# Patient Record
Sex: Male | Born: 1960
Health system: Southern US, Community
[De-identification: ages and names within clinical notes are randomized; demographics above are authoritative.]

## PROBLEM LIST (undated history)

## (undated) DIAGNOSIS — R42 Dizziness and giddiness: Secondary | ICD-10-CM

## (undated) DIAGNOSIS — K219 Gastro-esophageal reflux disease without esophagitis: Secondary | ICD-10-CM

## (undated) DIAGNOSIS — F1011 Alcohol abuse, in remission: Secondary | ICD-10-CM

## (undated) DIAGNOSIS — I10 Essential (primary) hypertension: Secondary | ICD-10-CM

## (undated) DIAGNOSIS — Z789 Other specified health status: Secondary | ICD-10-CM

## (undated) DIAGNOSIS — E785 Hyperlipidemia, unspecified: Secondary | ICD-10-CM

## (undated) DIAGNOSIS — IMO0001 Reserved for inherently not codable concepts without codable children: Secondary | ICD-10-CM

## (undated) DIAGNOSIS — F419 Anxiety disorder, unspecified: Secondary | ICD-10-CM

## (undated) HISTORY — DX: Essential (primary) hypertension: I10

## (undated) HISTORY — DX: Hyperlipidemia, unspecified: E78.5

## (undated) HISTORY — PX: VASECTOMY: SHX75

## (undated) HISTORY — DX: Gastro-esophageal reflux disease without esophagitis: K21.9

---

## 2007-08-10 ENCOUNTER — Ambulatory Visit (HOSPITAL_COMMUNITY): Admission: RE | Admit: 2007-08-10 | Discharge: 2007-08-10 | Payer: Self-pay | Admitting: Orthopedic Surgery

## 2008-09-11 ENCOUNTER — Encounter: Admission: RE | Admit: 2008-09-11 | Discharge: 2008-09-11 | Payer: Self-pay | Admitting: Surgery

## 2008-09-12 ENCOUNTER — Ambulatory Visit (HOSPITAL_BASED_OUTPATIENT_CLINIC_OR_DEPARTMENT_OTHER): Admission: RE | Admit: 2008-09-12 | Discharge: 2008-09-12 | Payer: Self-pay | Admitting: Surgery

## 2010-10-08 LAB — CBC
Hemoglobin: 17.2 g/dL — ABNORMAL HIGH (ref 13.0–17.0)
RBC: 4.69 MIL/uL (ref 4.22–5.81)
WBC: 3.8 10*3/uL — ABNORMAL LOW (ref 4.0–10.5)

## 2010-10-08 LAB — COMPREHENSIVE METABOLIC PANEL
ALT: 127 U/L — ABNORMAL HIGH (ref 0–53)
AST: 80 U/L — ABNORMAL HIGH (ref 0–37)
Alkaline Phosphatase: 46 U/L (ref 39–117)
CO2: 28 mEq/L (ref 19–32)
Calcium: 10.1 mg/dL (ref 8.4–10.5)
Chloride: 102 mEq/L (ref 96–112)
GFR calc Af Amer: >60 mL/min (ref >60)
GFR calc non Af Amer: >60 mL/min (ref >60)
Glucose, Bld: 86 mg/dL (ref 70–99)
Potassium: 4.1 mEq/L (ref 3.5–5.1)
Sodium: 139 mEq/L (ref 135–145)

## 2010-10-08 LAB — DIFFERENTIAL
Basophils Relative: 1 % (ref 0–1)
Eosinophils Absolute: 0.1 10*3/uL (ref 0.0–0.7)
Eosinophils Relative: 2 % (ref 0–5)
Lymphs Abs: 1.7 10*3/uL (ref 0.7–4.0)
Neutrophils Relative %: 41 % — ABNORMAL LOW (ref 43–77)
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
## 2010-11-10 NOTE — Op Note (Signed)
NAME:  Christopher Jones, Christopher Jones NO.:  [id number]   MEDICAL RECORD NO.:  [id number]          PATIENT TYPE:  AMB   LOCATION:  DSC                          FACILITY:  [facility]   PHYSICIAN:  Thomas A. Cornett, M.D.DATE OF BIRTH:  1961/02/19   DATE OF PROCEDURE:  DATE OF DISCHARGE:                               OPERATIVE REPORT   PREOPERATIVE DIAGNOSIS:  Right inguinal hernia.   POSTOPERATIVE DIAGNOSIS:  Right inguinal hernia.   PROCEDURE:  Repair of right inguinal hernia with mesh.   SURGEON:  Maisie Fus A. Cornett, MD   ANESTHESIA:  LMA with 0.25% Sensorcaine with epinephrine local.   ESTIMATED BLOOD LOSS:  20 mL.   SPECIMENS:  None.   INDICATIONS FOR PROCEDURE:  The patient is a 50 year old male with a  symptomatic right inguinal hernia that he wished to have repaired due to  pain.  He presents today for that after discussion in the office about  the procedure, potential complications, and long-term expectations and  potential outcomes.   DESCRIPTION OF PROCEDURE:  The patient was brought to the operating room  and placed supine.  After induction of general anesthesia, the right  inguinal region was marked preoperatively with the patient.  It was then  prepped and draped in a sterile fashion.  An oblique incision was made  in the right inguinal crease after infiltration with 0.25% Sensorcaine.  Dissection was carried down through Scarpa fascia.  The superficial  epigastric vessels were ligated with 3-0 Vicryl.  The Scarpa fascia was  then opened further.  We then visualized the aponeurosis of the external  oblique, infiltrated with 0.25% Sensorcaine under it and opened it in  the direction of its fibers.  The cord structures were identified and  encircled with a 1/4-inch Penrose drain.  There was a large indirect  hernia and I was able to dissect this off the cord and reduce it back  into the preperitoneal space without difficulty.  I then used my fingers  to bluntly  dissect in the preperitoneal space.  A large Prolene hernia  system mesh was then placed with the inner leaflet in the preperitoneal  space and I used my finger to help spread this out.  The onlay piece  laid nicely on the floor of the inguinal canal.  I used a single stitch  of 0-Vicryl to secure the connector piece to the internal oblique  medially and a second 0-Vicryl stitch to secure the onlay piece down to  the pubic tubercle, Cooper ligament region.  We then secured the  remainder of the mesh to the shelving edge of the inguinal ligament with  0-Novofil as well as the internal oblique.  A branch of the ilioinguinal  nerve was divided since it was caught up under the mesh and was  concerned for postoperative pain from that.  The main trunk of the ileal  inguinal nerve coursed along with cord structures and I was able to  preserve it.  There was also a branch of the iliohypogastric nerve as  well that I had to ligate since it was caught up under the  mesh and  there was no way I could bring this out without fear of the mesh  scarring down to it.  This was done with cautery so that the other end  would retract back up into the muscle which it did.  At this point in  time, a slit was cut of cord structures and Novofil was used to close  the mesh around the cord without difficulty.  I could insert the tip of  my fifth digit around new internal ring and this was comfortable and not  tight.  Irrigation was used and suctioned out.  We closed the  aponeurosis and the external oblique with 2-0 Vicryl  and 2-0 Vicryl was used to close the subcu tissues and 4-0 Monocryl was  used to close the skin in a subcuticular fashion.  Dermabond was  applied.  All final counts of sponge, needle, and instrument were found  to be correct at this portion of the case.  The patient was then awoke  and taken to recovery in satisfactory condition.      Thomas A. Cornett, M.D.  Electronically Signed      TAC/MEDQ  D:  09/12/2008  T:  09/12/2008  Job:  [id number]

## 2011-03-19 LAB — COMPREHENSIVE METABOLIC PANEL
ALT: 55 — ABNORMAL HIGH
AST: 42 — ABNORMAL HIGH
Albumin: 4.6
Calcium: 9.6
Chloride: 104
Creatinine, Ser: 0.91
GFR calc Af Amer: >60
Sodium: 141

## 2011-03-19 LAB — CBC
MCHC: 34.7
MCV: 101.3 — ABNORMAL HIGH
Platelets: 209
RBC: 4.61
WBC: 5.1

## 2011-03-19 LAB — DIFFERENTIAL
Eosinophils Absolute: 0.2
Eosinophils Relative: 4
Lymphocytes Relative: 34
Lymphs Abs: 1.7
Monocytes Absolute: 0.4

## 2011-06-29 HISTORY — PX: SHOULDER ARTHROSCOPY: SHX128

## 2011-10-15 ENCOUNTER — Other Ambulatory Visit: Payer: Self-pay | Admitting: Family Medicine

## 2011-10-15 DIAGNOSIS — R7989 Other specified abnormal findings of blood chemistry: Secondary | ICD-10-CM

## 2011-10-25 ENCOUNTER — Inpatient Hospital Stay
Admission: RE | Admit: 2011-10-25 | Discharge: 2011-10-25 | Payer: Self-pay | Source: Ambulatory Visit | Attending: Family Medicine | Admitting: Family Medicine

## 2011-11-01 ENCOUNTER — Ambulatory Visit
Admission: RE | Admit: 2011-11-01 | Discharge: 2011-11-01 | Disposition: A | Discharge status: Home / Self Care | Payer: Managed Care, Other (non HMO) | Source: Ambulatory Visit | Attending: Family Medicine | Admitting: Family Medicine

## 2011-11-01 DIAGNOSIS — R7989 Other specified abnormal findings of blood chemistry: Secondary | ICD-10-CM

## 2012-04-18 ENCOUNTER — Other Ambulatory Visit: Payer: Self-pay | Admitting: Gastroenterology

## 2012-04-18 DIAGNOSIS — K625 Hemorrhage of anus and rectum: Secondary | ICD-10-CM

## 2012-04-27 ENCOUNTER — Encounter (INDEPENDENT_AMBULATORY_CARE_PROVIDER_SITE_OTHER): Payer: Managed Care, Other (non HMO) | Admitting: General Surgery

## 2012-05-16 ENCOUNTER — Ambulatory Visit (INDEPENDENT_AMBULATORY_CARE_PROVIDER_SITE_OTHER): Payer: Managed Care, Other (non HMO) | Admitting: General Surgery

## 2012-05-16 ENCOUNTER — Encounter (INDEPENDENT_AMBULATORY_CARE_PROVIDER_SITE_OTHER): Payer: Self-pay | Admitting: General Surgery

## 2012-05-16 VITALS — BP 144/92 | HR 80 | Temp 97.2°F | Resp 18 | Ht 72.0 in | Wt 260.0 lb

## 2012-05-16 DIAGNOSIS — L290 Pruritus ani: Secondary | ICD-10-CM | POA: Insufficient documentation

## 2012-05-16 NOTE — Patient Instructions (Addendum)
Pruritus Ani  What is Pruritus Ani (proo-r-tus a-n)? Itching around the anal area is called pruritus ani. This condition results in a compelling urge to scratch. What causes this to happen? Several factors may be at fault. A common cause is excessive moisture in the anal area. Moisture may be due to perspiration or a small amount of residual stool around the anal area. Pruritis ani may be a symptom of other common anal conditions such as hemorrhoids and anal fissures. The initial condition can be made worse by scratching, vigorous cleansing of the area or overuse of topical treatments. In some individuals pruritus ani may be caused by eating certain foods, smoking and drinking alcoholic beverages, especially beer and wine. Food items that have been associated with pruritus ani include: . Coffee, Tea . Carbonated beverages . Milk products . Tomatoes and tomato products such as Ketchup . Cheese . Chocolate . Nuts Does Pruritus Ani result from lack of cleanliness? Cleanliness is almost never a factor. However, the natural tendency once a person develops this itching is to wash the area vigorously and frequently with soap and a washcloth. This almost always makes the problem worse by damaging the skin and washing away protective natural oils. What can be done to make this itching go away? A careful examination by a colon and rectal surgeon or other physician may identify a definite cause for the itching. Your physician can recommend treatment to eliminate the specific problem. Treatment of pruritus ani may include these three points. 1. AVOID MOISTURE in the anal area: . Apply either a few wisps of cotton, a 4 x 4 gauze or some cornstarch powder to keep the area dry. . Avoid all medicated, perfumed and deodorant powders. 2. AVOID FURTHER TRAUMA to the affected area: . Do not use soap of any kind on the anal area. . Do not scrub the anal area with anything - even toilet paper. . For hygiene, it  is best to rinse with warm water and pat the area dry. Use wet toilet paper, baby wipes or a wet washcloth to blot the area clean. Never rub. . Try not to scratch the itchy area. Scratching produces more damage, which in turn makes the itching worse. For individuals that experience irresistible itching at night, wearing socks on the hands may be helpful. 3. USE ONLY MEDICATIONS AS DIRECTED BY YOUR PHYSICIAN. Apply prescription medications sparingly to the skin around the anal area and avoid rubbing. Prolonged use of prescribed or over the counter topical medications may result in irritation or skin dryness that can make the condition worse. How long does this treatment usually take? Most people experience some relief from itching within a week. If symptoms do not resolve after 6 weeks, a follow-up appointment with your colon and rectal surgeon may be needed.               2012 American Society of Colon [employer] Rectal [employer]  

## 2012-05-16 NOTE — Progress Notes (Signed)
Chief Complaint  Patient presents with  . Rectal Problems    anal tag    HISTORY: Christopher Jones is a 51 y.o. male who presents to the office with anal itching.  Other symptoms include inability to get clean.  He has tried medicated wipes in the past no success.  Wiping the area makes the symptoms worse.  This had been occurring for several years.  His bowel habits are regular and his bowel movements are soft.  His fiber intake is moderate.  His last colonoscopy was in Oct where 2 tubular adenomas were found.    Past Medical History  Diagnosis Date  . Hypertension   . Hyperlipidemia   . GERD (gastroesophageal reflux disease)       Past Surgical History  Procedure Date  . Shoulder arthroscopy   . Vasectomy         Current Outpatient Prescriptions  Medication Sig Dispense Refill  . ampicillin (PRINCIPEN) 500 MG capsule Take 500 mg by mouth 2 (two) times daily.      Marland Kitchen aspirin 81 MG tablet Take 81 mg by mouth daily.      . B Complex-C (B-COMPLEX WITH VITAMIN C) tablet Take 1 tablet by mouth daily.      Marland Kitchen esomeprazole (NEXIUM) 40 MG capsule Take 40 mg by mouth daily before breakfast.      . fenofibrate (TRICOR) 145 MG tablet Take 145 mg by mouth daily.      . fish oil-omega-3 fatty acids 1000 MG capsule Take 2 g by mouth daily.      Marland Kitchen Lysine 500 MG CAPS Take by mouth.      . Multiple Vitamins-Minerals (MULTIVITAMIN WITH MINERALS) tablet Take 1 tablet by mouth daily.      . nebivolol (BYSTOLIC) 10 MG tablet Take 10 mg by mouth daily.      Marland Kitchen zolpidem (AMBIEN CR) 12.5 MG CR tablet Take 12.5 mg by mouth at bedtime as needed.          Allergies no known allergies    Family History  Problem Relation Age of Onset  . Hypertension Mother   . Diabetes Father   . Arthritis Father   . Heart disease Father     History   Social History  . Marital Status: Married    Spouse Name: N/A    Number of Children: N/A  . Years of Education: N/A   Social History Main Topics  . Smoking  status: Never Smoker   . Smokeless tobacco: Not on file  . Alcohol Use: Yes  . Drug Use: No  . Sexually Active:    Other Topics Concern  . Not on file   Social History Narrative  . No narrative on file      REVIEW OF SYSTEMS - PERTINENT POSITIVES ONLY: Review of Systems - General ROS: negative for - chills or fever Hematological and Lymphatic ROS: negative for - bleeding problems or blood clots Gastrointestinal ROS: no abdominal pain, change in bowel habits, or black or bloody stools Genito-Urinary ROS: no dysuria, trouble voiding, or hematuria  EXAM: Filed Vitals:   05/16/12 0919  BP: 144/92  Pulse: 80  Temp: 97.2 F (36.2 C)  Resp: 18    General appearance: alert and cooperative GI: soft, non-tender; bowel sounds normal; no masses,  no organomegaly   Procedure: Anoscopy Surgeon: Maisie Fus Diagnosis: anal itching  Assistant: Christella Scheuermann After the risks and benefits were explained, verbal consent was obtained for above procedure  Anesthesia: none Findings:  L lateral skin tag, puritis ani with fissuring, min internal hemorrhoids   ASSESSMENT AND PLAN Christopher Jones is a 51 y.o. M who presents to the office with an anal skin tag.  This causes him discomfort and itching.  It is hard to get the area clean.  He would like it removed.  On exam he does have a pedunculated left lateral skin tag and pretty significant anal irritation with some skin fissuring.  I have given him a handout of changes that he can make to help with this.  He will try these for a few weeks, and if he is still having symptoms, we will remove his skin tag in the office.     Vanita Panda, MD Colon and Rectal Surgery / General Surgery [facility]      Visit Diagnoses: 1. Anal itching     Primary Care Physician: No primary provider on file.

## 2012-05-30 ENCOUNTER — Other Ambulatory Visit: Payer: Managed Care, Other (non HMO)

## 2012-06-06 ENCOUNTER — Ambulatory Visit (INDEPENDENT_AMBULATORY_CARE_PROVIDER_SITE_OTHER): Payer: Managed Care, Other (non HMO) | Admitting: General Surgery

## 2012-06-09 ENCOUNTER — Ambulatory Visit
Admission: RE | Admit: 2012-06-09 | Discharge: 2012-06-09 | Disposition: A | Discharge status: Home / Self Care | Payer: Managed Care, Other (non HMO) | Source: Ambulatory Visit | Attending: Gastroenterology | Admitting: Gastroenterology

## 2012-06-09 DIAGNOSIS — K625 Hemorrhage of anus and rectum: Secondary | ICD-10-CM

## 2012-06-30 ENCOUNTER — Encounter (INDEPENDENT_AMBULATORY_CARE_PROVIDER_SITE_OTHER): Payer: Self-pay | Admitting: General Surgery

## 2012-07-20 ENCOUNTER — Encounter (INDEPENDENT_AMBULATORY_CARE_PROVIDER_SITE_OTHER): Payer: Self-pay

## 2013-02-12 ENCOUNTER — Other Ambulatory Visit: Payer: Self-pay | Admitting: Family Medicine

## 2013-02-12 DIAGNOSIS — R748 Abnormal levels of other serum enzymes: Secondary | ICD-10-CM

## 2013-02-16 ENCOUNTER — Ambulatory Visit
Admission: RE | Admit: 2013-02-16 | Discharge: 2013-02-16 | Disposition: A | Discharge status: Home / Self Care | Payer: Managed Care, Other (non HMO) | Source: Ambulatory Visit | Attending: Family Medicine | Admitting: Family Medicine

## 2013-02-16 DIAGNOSIS — R748 Abnormal levels of other serum enzymes: Secondary | ICD-10-CM

## 2013-07-24 ENCOUNTER — Encounter (HOSPITAL_COMMUNITY): Payer: Self-pay | Admitting: Emergency Medicine

## 2013-07-24 ENCOUNTER — Emergency Department (HOSPITAL_COMMUNITY)
Admission: EM | Admit: 2013-07-24 | Discharge: 2013-07-24 | Disposition: A | Discharge status: Home / Self Care | Payer: 59 | Attending: Emergency Medicine | Admitting: Emergency Medicine

## 2013-07-24 DIAGNOSIS — F1010 Alcohol abuse, uncomplicated: Secondary | ICD-10-CM | POA: Insufficient documentation

## 2013-07-24 DIAGNOSIS — Z79899 Other long term (current) drug therapy: Secondary | ICD-10-CM | POA: Insufficient documentation

## 2013-07-24 DIAGNOSIS — Z792 Long term (current) use of antibiotics: Secondary | ICD-10-CM | POA: Insufficient documentation

## 2013-07-24 DIAGNOSIS — Z862 Personal history of diseases of the blood and blood-forming organs and certain disorders involving the immune mechanism: Secondary | ICD-10-CM | POA: Insufficient documentation

## 2013-07-24 DIAGNOSIS — Z7982 Long term (current) use of aspirin: Secondary | ICD-10-CM | POA: Insufficient documentation

## 2013-07-24 DIAGNOSIS — Z8639 Personal history of other endocrine, nutritional and metabolic disease: Secondary | ICD-10-CM | POA: Insufficient documentation

## 2013-07-24 DIAGNOSIS — K219 Gastro-esophageal reflux disease without esophagitis: Secondary | ICD-10-CM | POA: Insufficient documentation

## 2013-07-24 DIAGNOSIS — I10 Essential (primary) hypertension: Secondary | ICD-10-CM | POA: Insufficient documentation

## 2013-07-24 LAB — COMPREHENSIVE METABOLIC PANEL
ALBUMIN: 4.5 g/dL (ref 3.5–5.2)
ALK PHOS: 60 U/L (ref 39–117)
ALT: 127 U/L — AB (ref 0–53)
AST: 121 U/L — ABNORMAL HIGH (ref 0–37)
BILIRUBIN TOTAL: 0.5 mg/dL (ref 0.3–1.2)
BUN: 8 mg/dL (ref 6–23)
CHLORIDE: 107 meq/L (ref 96–112)
CO2: 25 mEq/L (ref 19–32)
Calcium: 9.2 mg/dL (ref 8.4–10.5)
Creatinine, Ser: 0.64 mg/dL (ref 0.50–1.35)
GFR calc Af Amer: >90 mL/min (ref >90)
GFR calc non Af Amer: >90 mL/min (ref >90)
Glucose, Bld: 103 mg/dL — ABNORMAL HIGH (ref 70–99)
POTASSIUM: 4 meq/L (ref 3.7–5.3)
SODIUM: 150 meq/L — AB (ref 137–147)
Total Protein: 7.6 g/dL (ref 6.0–8.3)

## 2013-07-24 LAB — CBC
HCT: 51.4 % (ref 39.0–52.0)
Hemoglobin: 18.6 g/dL — ABNORMAL HIGH (ref 13.0–17.0)
MCH: 36.6 pg — ABNORMAL HIGH (ref 26.0–34.0)
MCHC: 36.2 g/dL — ABNORMAL HIGH (ref 30.0–36.0)
MCV: 101.2 fL — ABNORMAL HIGH (ref 78.0–100.0)
Platelets: 130 10*3/uL — ABNORMAL LOW (ref 150–400)
RBC: 5.08 MIL/uL (ref 4.22–5.81)
RDW: 13 % (ref 11.5–15.5)
WBC: 4.8 10*3/uL (ref 4.0–10.5)

## 2013-07-24 LAB — RAPID URINE DRUG SCREEN, HOSP PERFORMED
AMPHETAMINES: NOT DETECTED
BARBITURATES: NOT DETECTED
Benzodiazepines: NOT DETECTED
COCAINE: NOT DETECTED
OPIATES: NOT DETECTED
TETRAHYDROCANNABINOL: NOT DETECTED

## 2013-07-24 LAB — ACETAMINOPHEN LEVEL: Acetaminophen (Tylenol), Serum: <15 ug/mL (ref 10–30)

## 2013-07-24 LAB — SALICYLATE LEVEL: Salicylate Lvl: <2 mg/dL — ABNORMAL LOW (ref 2.8–20.0)

## 2013-07-24 LAB — ETHANOL: ALCOHOL ETHYL (B): 269 mg/dL — AB (ref 0–11)

## 2013-07-24 MED ORDER — LORAZEPAM 1 MG PO TABS: 0.0000 mg | ORAL_TABLET | Freq: Four times a day (QID) | ORAL | Status: DC

## 2013-07-24 MED ORDER — LORAZEPAM 1 MG PO TABS: 0.0000 mg | ORAL_TABLET | Freq: Two times a day (BID) | ORAL | Status: DC

## 2013-07-24 NOTE — ED Notes (Addendum)
Pt states he has been accepted at [facility] and was sent here for medical clearance

## 2013-07-24 NOTE — Discharge Instructions (Signed)
Although you have requested discharge to arrange for your alcohol detoxification by yourself, do not hesitate to return here for concerning changes in your condition.    Alcohol Use Disorder Alcohol use disorder is a mental disorder. It is not a one-time incident of heavy drinking. Alcohol use disorder is the excessive and uncontrollable use of alcohol over time that leads to problems with functioning in one or more areas of daily living. People with this disorder risk harming themselves and others when they drink to excess. Alcohol use disorder also can cause other mental disorders, such as mood and anxiety disorders, and serious physical problems. People with alcohol use disorder often misuse other drugs.  Alcohol use disorder is common and widespread. Some people with this disorder drink alcohol to cope with or escape from negative life events. Others drink to relieve chronic pain or symptoms of mental illness. People with a family history of alcohol use disorder are at higher risk of losing control and using alcohol to excess.  SYMPTOMS  Signs and symptoms of alcohol use disorder may include the following:   Consumption ofalcohol inlarger amounts or over a longer period of time than intended.  Multiple unsuccessful attempts to cutdown or control alcohol use.   A great deal of time spent obtaining alcohol, using alcohol, or recovering from the effects of alcohol (hangover).  A strong desire or urge to use alcohol (cravings).   Continued use of alcohol despite problems at work, school, or home because of alcohol use.   Continued use of alcohol despite problems in relationships because of alcohol use.  Continued use of alcohol in situations when it is physically hazardous, such as driving a car.  Continued use of alcohol despite awareness of a physical or psychological problem that is likely related to alcohol use. Physical problems related to alcohol use can involve the brain, heart,  liver, stomach, and intestines. Psychological problems related to alcohol use include intoxication, depression, anxiety, psychosis, delirium, and dementia.   The need for increased amounts of alcohol to achieve the same desired effect, or a decreased effect from the consumption of the same amount of alcohol (tolerance).  Withdrawal symptoms upon reducing or stopping alcohol use, or alcohol use to reduce or avoid withdrawal symptoms. Withdrawal symptoms include:  Racing heart.  Hand tremor.  Difficulty sleeping.  Nausea.  Vomiting.  Hallucinations.  Restlessness.  Seizures. DIAGNOSIS Alcohol use disorder is diagnosed through an assessment by your caregiver. Your caregiver may start by asking three or four questions to screen for excessive or problematic alcohol use. To confirm a diagnosis of alcohol use disorder, at least two symptoms (see SYMPTOMS) must be present within a 7-month period. The severity of alcohol use disorder depends on the number of symptoms:  Mild two or three.  Moderate four or five.  Severe six or more. Your caregiver may perform a physical exam or use results from lab tests to see if you have physical problems resulting from alcohol use. Your caregiver may refer you to a mental health professional for evaluation. TREATMENT  Some people with alcohol use disorder are able to reduce their alcohol use to low-risk levels. Some people with alcohol use disorder need to quit drinking alcohol. When necessary, mental health professionals with specialized training in substance use treatment can help. Your caregiver can help you decide how severe your alcohol use disorder is and what type of treatment you need. The following forms of treatment are available:   Detoxification. Detoxification involves the use of  prescription medication to prevent alcohol withdrawal symptoms in the first week after quitting. This is important for people with a history of symptoms of  withdrawal and for heavy drinkers who are likely to have withdrawal symptoms. Alcohol withdrawal can be dangerous and, in severe cases, cause death. Detoxification is usually provided in a hospital or in-patient substance use treatment facility.  Counseling or talk therapy. Talk therapy is provided by substance use treatment counselors. It addresses the reasons people use alcohol and ways to keep them from drinking again. The goals of talk therapy are to help people with alcohol use disorder find healthy activities and ways to cope with life stress, to identify and avoid triggers for alcohol use, and to handle cravings, which can cause relapse.  Medication.Different medications can help treat alcohol use disorder through the following actions:  Decrease alcohol cravings.  Decrease the positive reward response felt from alcohol use.  Produce an uncomfortable physical reaction when alcohol is used (aversion therapy).  Support groups. Support groups are run by people who have quit drinking. They provide emotional support, advice, and guidance. These forms of treatment are often combined. Some people with alcohol use disorder benefit from intensive combination treatment provided by specialized substance use treatment centers. Both inpatient and outpatient treatment programs are available. Document Released: 07/22/2004 Document Revised: 02/14/2013 Document Reviewed: 09/21/2012 [facility] Patient Information 2014 [place].

## 2013-07-24 NOTE — ED Notes (Addendum)
Pt requesting detox from ETOH, states he either drinks a gallon of Vodka or a 1/2 gallon of Vodka and 3 bottles of wine per day, pt states he has been drinking for 40 years, completed detox 4-5 months ago, pt states he last drank prior to arrival of ED

## 2013-07-24 NOTE — ED Notes (Signed)
Patient states he is not sure if he wants to stay for detox or take his medical clearance discharge paperwork to [facility]. Dr. Vanita Panda aware. Pt contacted [facility] at this time to inquire about his options. Pt calm, cooperative.

## 2013-07-24 NOTE — ED Notes (Signed)
Pt comfortable with d/c and f/u instructions. No prescriptions 

## 2013-07-24 NOTE — ED Provider Notes (Signed)
CSN: [id number]     Arrival date & time 07/24/13  1000 History   First MD Initiated Contact with Patient 07/24/13 1113     Chief Complaint  Patient presents with  . Medical Clearance   (Consider location/radiation/quality/duration/timing/severity/associated sxs/prior Treatment) HPI Patient presents with a request for assistance with alcohol abuse counseling. Patient states that he has been drinking substantial amounts of alcohol, since he was 53 years old.  When he is one prior inpatient hospitalization for alcohol abuse. He notes that over the past months, and in the past days he has had substantial amounts of vodka, other alcohol. She denies other substance abuse. Denies physical pain currently. He has a history of withdrawal, though no seizures. Patient spoke with an inpatient center, was accepted for admission, but requires medical clearance.  On   Past Medical History  Diagnosis Date  . Hypertension   . Hyperlipidemia   . GERD (gastroesophageal reflux disease)    Past Surgical History  Procedure Laterality Date  . Shoulder arthroscopy    . Vasectomy     Family History  Problem Relation Age of Onset  . Hypertension Mother   . Diabetes Father   . Arthritis Father   . Heart disease Father    History  Substance Use Topics  . Smoking status: Never Smoker   . Smokeless tobacco: Not on file  . Alcohol Use: Yes    Review of Systems  Constitutional:       Per HPI, otherwise negative  HENT:       Per HPI, otherwise negative  Respiratory:       Per HPI, otherwise negative  Cardiovascular:       Per HPI, otherwise negative  Gastrointestinal: Negative for vomiting.  Endocrine:       Negative aside from HPI  Genitourinary:       Neg aside from HPI   Musculoskeletal:       Per HPI, otherwise negative  Skin: Negative.   Neurological: Negative for syncope.  Psychiatric/Behavioral:       History of present illness    Allergies  Review of patient's allergies  indicates no known allergies.  Home Medications   Current Outpatient Rx  Name  Route  Sig  Dispense  Refill  . ALPRAZolam (XANAX) 1 MG tablet   Oral   Take 1 mg by mouth 3 (three) times daily as needed. For anxiety         . ampicillin (PRINCIPEN) 500 MG capsule   Oral   Take 500 mg by mouth 2 (two) times daily.         Marland Kitchen aspirin 81 MG tablet   Oral   Take 81 mg by mouth daily.         . B Complex-C (B-COMPLEX WITH VITAMIN C) tablet   Oral   Take 1 tablet by mouth daily.         . fish oil-omega-3 fatty acids 1000 MG capsule   Oral   Take 3 g by mouth daily.          Marland Kitchen Lysine 500 MG CAPS   Oral   Take 2 capsules by mouth daily.          . Multiple Vitamins-Minerals (MULTIVITAMIN WITH MINERALS) tablet   Oral   Take 1 tablet by mouth daily.         . Nebivolol HCl 20 MG TABS   Oral   Take 40 mg by mouth daily.         Marland Kitchen  omeprazole (PRILOSEC) 40 MG capsule   Oral   Take 40 mg by mouth daily.         Marland Kitchen zolpidem (AMBIEN CR) 12.5 MG CR tablet   Oral   Take 12.5 mg by mouth at bedtime as needed for sleep.           BP 142/96  Pulse 93  Temp(Src) 97.7 F (36.5 C) (Oral)  Resp 18  SpO2 95% Physical Exam  Nursing note and vitals reviewed. Constitutional: He is oriented to person, place, and time. He appears well-developed. No distress.  HENT:  Head: Normocephalic and atraumatic.  Eyes: Conjunctivae and EOM are normal.  Cardiovascular: Normal rate and regular rhythm.   Pulmonary/Chest: Effort normal. No stridor. No respiratory distress.  Abdominal: He exhibits no distension.  Musculoskeletal: He exhibits no edema.  Neurological: He is alert and oriented to person, place, and time.  Skin: Skin is warm and dry.  Psychiatric: He has a normal mood and affect. His behavior is normal. Thought content normal.    ED Course  Procedures (including critical care time) Labs Review Labs Reviewed  CBC  COMPREHENSIVE METABOLIC PANEL  ETHANOL   ACETAMINOPHEN LEVEL  SALICYLATE LEVEL  URINE RAPID DRUG SCREEN (HOSP PERFORMED)   Imaging Review No results found.  EKG Interpretation   None      1:03 PM Patient requests discharge.   MDM  No diagnosis found. Patient presents requesting assistance with consultation into alcohol detoxication program.  Patient is in no distress, awake, alert and hemodynamically stable, with no evidence of ongoing withdrawal.  After the initial evaluation the patient requested discharge, and given his capacity to make this request, this was accommodated.    Carmin Muskrat, MD 07/24/13 1304

## 2013-07-24 NOTE — ED Notes (Signed)
Patient states would like to be discharged home. Pt is calm, cooperative, Oriented x4 and in NAD. Dr. Vanita Panda made aware and states OK for discharge.

## 2014-03-22 ENCOUNTER — Other Ambulatory Visit: Payer: Self-pay | Admitting: Family Medicine

## 2014-03-22 DIAGNOSIS — R42 Dizziness and giddiness: Secondary | ICD-10-CM

## 2014-03-23 ENCOUNTER — Ambulatory Visit
Admission: RE | Admit: 2014-03-23 | Discharge: 2014-03-23 | Disposition: A | Discharge status: Home / Self Care | Payer: 59 | Source: Ambulatory Visit | Attending: Family Medicine | Admitting: Family Medicine

## 2014-03-23 DIAGNOSIS — R42 Dizziness and giddiness: Secondary | ICD-10-CM

## 2014-03-23 MED ORDER — GADOBENATE DIMEGLUMINE 529 MG/ML IV SOLN
20.0000 mL | Freq: Once | INTRAVENOUS | Status: AC | PRN
  Administered 2014-03-23: 20 mL via INTRAVENOUS

## 2015-02-12 ENCOUNTER — Encounter (HOSPITAL_COMMUNITY): Payer: Self-pay | Admitting: *Deleted

## 2015-02-12 ENCOUNTER — Emergency Department (HOSPITAL_COMMUNITY)
Admission: EM | Admit: 2015-02-12 | Discharge: 2015-02-12 | Disposition: A | Discharge status: Home / Self Care | Payer: 59 | Attending: Emergency Medicine | Admitting: Emergency Medicine

## 2015-02-12 ENCOUNTER — Emergency Department (HOSPITAL_COMMUNITY): Payer: 59

## 2015-02-12 DIAGNOSIS — K219 Gastro-esophageal reflux disease without esophagitis: Secondary | ICD-10-CM | POA: Insufficient documentation

## 2015-02-12 DIAGNOSIS — Z79899 Other long term (current) drug therapy: Secondary | ICD-10-CM | POA: Insufficient documentation

## 2015-02-12 DIAGNOSIS — K7460 Unspecified cirrhosis of liver: Secondary | ICD-10-CM | POA: Diagnosis not present

## 2015-02-12 DIAGNOSIS — I10 Essential (primary) hypertension: Secondary | ICD-10-CM | POA: Insufficient documentation

## 2015-02-12 DIAGNOSIS — R1010 Upper abdominal pain, unspecified: Secondary | ICD-10-CM | POA: Diagnosis present

## 2015-02-12 DIAGNOSIS — K8020 Calculus of gallbladder without cholecystitis without obstruction: Secondary | ICD-10-CM | POA: Insufficient documentation

## 2015-02-12 DIAGNOSIS — K7689 Other specified diseases of liver: Secondary | ICD-10-CM | POA: Insufficient documentation

## 2015-02-12 DIAGNOSIS — Z792 Long term (current) use of antibiotics: Secondary | ICD-10-CM | POA: Diagnosis not present

## 2015-02-12 LAB — COMPREHENSIVE METABOLIC PANEL
ALT: 117 U/L — AB (ref 17–63)
AST: 212 U/L — AB (ref 15–41)
Albumin: 4.2 g/dL (ref 3.5–5.0)
Alkaline Phosphatase: 118 U/L (ref 38–126)
Anion gap: 11 (ref 5–15)
BUN: 8 mg/dL (ref 6–20)
CHLORIDE: 102 mmol/L (ref 101–111)
CO2: 26 mmol/L (ref 22–32)
Calcium: 9.8 mg/dL (ref 8.9–10.3)
Creatinine, Ser: 0.65 mg/dL (ref 0.61–1.24)
GFR calc Af Amer: >60 mL/min (ref >60)
GFR calc non Af Amer: >60 mL/min (ref >60)
GLUCOSE: 107 mg/dL — AB (ref 65–99)
Potassium: 4.3 mmol/L (ref 3.5–5.1)
Sodium: 139 mmol/L (ref 135–145)
Total Bilirubin: 1 mg/dL (ref 0.3–1.2)
Total Protein: 7.7 g/dL (ref 6.5–8.1)

## 2015-02-12 LAB — URINALYSIS, ROUTINE W REFLEX MICROSCOPIC
Bilirubin Urine: NEGATIVE
GLUCOSE, UA: NEGATIVE mg/dL
HGB URINE DIPSTICK: NEGATIVE
KETONES UR: NEGATIVE mg/dL
Leukocytes, UA: NEGATIVE
Nitrite: NEGATIVE
Protein, ur: NEGATIVE mg/dL
Specific Gravity, Urine: 1.005 (ref 1.005–1.030)
Urobilinogen, UA: 0.2 mg/dL (ref 0.0–1.0)
pH: 6 (ref 5.0–8.0)

## 2015-02-12 LAB — CBC
HCT: 47.8 % (ref 39.0–52.0)
Hemoglobin: 16.4 g/dL (ref 13.0–17.0)
MCH: 36.5 pg — ABNORMAL HIGH (ref 26.0–34.0)
MCHC: 34.3 g/dL (ref 30.0–36.0)
MCV: 106.5 fL — AB (ref 78.0–100.0)
PLATELETS: 154 10*3/uL (ref 150–400)
RBC: 4.49 MIL/uL (ref 4.22–5.81)
RDW: 12.5 % (ref 11.5–15.5)
WBC: 8 10*3/uL (ref 4.0–10.5)

## 2015-02-12 LAB — LIPASE, BLOOD: LIPASE: 48 U/L (ref 22–51)

## 2015-02-12 MED ORDER — IOHEXOL 300 MG/ML  SOLN
100.0000 mL | Freq: Once | INTRAMUSCULAR | Status: AC | PRN
  Administered 2015-02-12: 100 mL via INTRAVENOUS

## 2015-02-12 MED ORDER — SUCRALFATE 1 G PO TABS: 1.0000 g | ORAL_TABLET | Freq: Three times a day (TID) | ORAL | Status: DC

## 2015-02-12 MED ORDER — IOHEXOL 300 MG/ML  SOLN
25.0000 mL | Freq: Once | INTRAMUSCULAR | Status: AC | PRN
  Administered 2015-02-12: 25 mL via ORAL

## 2015-02-12 NOTE — ED Notes (Addendum)
Pt states he has had upper abdominal pain and for the past week. Pt states he has had frequent diarrhea for the past 6 months. Pt states he is concerned his abdominal pain is coming from his heavy alcohol consumption. Pt states he drinks ~20oz of vodka a day. Pt states he would not like treatment for alcohol addiction. Pt denies nausea, vomiting, blood in stool. Pt states the pain is worse after drinking alcohol.

## 2015-02-12 NOTE — Progress Notes (Signed)
ED CM verified pt pcp as sharon wolters Pt in room but not in hospital bed He is sitting in bedside chair in room

## 2015-02-12 NOTE — ED Provider Notes (Signed)
CSN: [id number]     Arrival date & time 02/12/15  16 History   First MD Initiated Contact with Patient 02/12/15 1507     Chief Complaint  Patient presents with  . Abdominal Pain  . Diarrhea     (Consider location/radiation/quality/duration/timing/severity/associated sxs/prior Treatment) HPI Christopher Jones is a 54 y.o. male with history of hypertension, hyperlipidemia, GERD, alcoholism, presents to emergency department complaining of upper abdominal pain. Patient states the pain started 1 week ago. At that time patient states pain was 10 out of 10. He states gradually over last several days pain has been improving, but due to some personal circumstances he was unable to come to emergency department. He states today pain is 1 out of 10 but it is still there and he states he is concerned and wants to figure out what went on. He denies any nausea vomiting, but he states he has diarrhea, multiple episodes a day. Diarrhea is watery, with no blood or discoloration. He states he does not have any nausea or increased pain with eating. He states he does have increased pain with drinking alcohol. Patient drinks daily, approximately 20 ounces of vodka a day. Patient is not interested in treatment for alcohol addiction. He denies any prior history of similar pain. He states that he feels like his abdomen is distended and is harder than usual. He states "I feel like I am pregnant." No tx tried prior to coming in.   Past Medical History  Diagnosis Date  . Hypertension   . Hyperlipidemia   . GERD (gastroesophageal reflux disease)    Past Surgical History  Procedure Laterality Date  . Shoulder arthroscopy    . Vasectomy     Family History  Problem Relation Age of Onset  . Hypertension Mother   . Diabetes Father   . Arthritis Father   . Heart disease Father    Social History  Substance Use Topics  . Smoking status: Never Smoker   . Smokeless tobacco: None  . Alcohol Use: Yes    Review of  Systems  Constitutional: Negative for fever and chills.  Respiratory: Negative for cough, chest tightness and shortness of breath.   Cardiovascular: Negative for chest pain, palpitations and leg swelling.  Gastrointestinal: Positive for abdominal pain and diarrhea. Negative for nausea, vomiting and abdominal distention.  Genitourinary: Negative for dysuria, urgency, frequency and hematuria.  Musculoskeletal: Negative for myalgias, arthralgias, neck pain and neck stiffness.  Skin: Negative for rash.  Allergic/Immunologic: Negative for immunocompromised state.  Neurological: Negative for dizziness, weakness, light-headedness, numbness and headaches.  All other systems reviewed and are negative.     Allergies  Shellfish allergy  Home Medications   Prior to Admission medications   Medication Sig Start Date End Date Taking? Authorizing Provider  ALPRAZolam Duanne Moron) 1 MG tablet Take 1 mg by mouth 3 (three) times daily as needed. For anxiety 07/17/13  Yes Historical Provider, MD  ampicillin (PRINCIPEN) 500 MG capsule Take 500 mg by mouth 2 (two) times daily.   Yes Historical Provider, MD  B Complex-C (B-COMPLEX WITH VITAMIN C) tablet Take 1 tablet by mouth daily.   Yes Historical Provider, MD  escitalopram (LEXAPRO) 20 MG tablet Take 20 mg by mouth daily. 01/16/15  Yes Historical Provider, MD  fish oil-omega-3 fatty acids 1000 MG capsule Take 3 g by mouth daily.    Yes Historical Provider, MD  Lysine 500 MG CAPS Take 1,000 mg by mouth daily.    Yes Historical Provider, MD  Multiple Vitamins-Minerals (MULTIVITAMIN WITH MINERALS) tablet Take 1 tablet by mouth daily.   Yes Historical Provider, MD  Nebivolol HCl 20 MG TABS Take 40 mg by mouth daily.   Yes Historical Provider, MD  omeprazole (PRILOSEC) 40 MG capsule Take 40 mg by mouth daily. 05/15/13  Yes Historical Provider, MD  traZODone (DESYREL) 100 MG tablet Take 100 mg by mouth at bedtime. 01/07/15  Yes Historical Provider, MD   BP 157/97  mmHg  Pulse 83  Temp(Src) 98.2 F (36.8 C) (Oral)  Resp 18  SpO2 95% Physical Exam  Constitutional: He appears well-developed and well-nourished. No distress.  HENT:  Head: Normocephalic and atraumatic.  Eyes: Conjunctivae are normal.  Neck: Neck supple.  Cardiovascular: Normal rate, regular rhythm and normal heart sounds.   Pulmonary/Chest: Effort normal. No respiratory distress. He has no wheezes. He has no rales.  Abdominal: Soft. Bowel sounds are normal. He exhibits no distension. There is tenderness. There is no rebound.  Tenderness to palpation in epigastric area, left upper quadrant, left lower quadrant. No guarding.  Musculoskeletal: He exhibits no edema.  Neurological: He is alert.  Skin: Skin is warm and dry.  Nursing note and vitals reviewed.   ED Course  Procedures (including critical care time) Labs Review Labs Reviewed  COMPREHENSIVE METABOLIC PANEL - Abnormal; Notable for the following:    Glucose, Bld 107 (*)    AST 212 (*)    ALT 117 (*)    All other components within normal limits  CBC - Abnormal; Notable for the following:    MCV 106.5 (*)    MCH 36.5 (*)    All other components within normal limits  LIPASE, BLOOD  URINALYSIS, ROUTINE W REFLEX MICROSCOPIC (NOT AT [facility])    Imaging Review Ct Abdomen Pelvis W Contrast  02/12/2015   CLINICAL DATA:  Upper abdominal pain for 1 week. Chronic diarrhea. Alcohol abuse.  EXAM: CT ABDOMEN AND PELVIS WITH CONTRAST  TECHNIQUE: Multidetector CT imaging of the abdomen and pelvis was performed using the standard protocol following bolus administration of intravenous contrast.  CONTRAST:  127mL OMNIPAQUE IOHEXOL 300 MG/ML  SOLN  COMPARISON:  None.  FINDINGS: Lower Chest: No acute findings.  Hepatobiliary: Moderate diffuse hepatic steatosis noted. Relative enlargement of the left hepatic lobe compared to the right noted and suspicious for hepatic cirrhosis.  A 7 mm hypervascular lesion is seen in the posterior dome of the  right hepatic lobe on image 13/series 2. No other liver masses are identified. Tiny gallstones or gallbladder sludge noted, without evidence cholecystitis or biliary dilatation.  Pancreas: No mass, inflammatory changes, or other significant abnormality identified.  Spleen:  Within normal limits in size and appearance.  Adrenals:  No masses identified.  Kidneys/Urinary Tract:  No evidence of masses or hydronephrosis.  Stomach/Bowel/Peritoneum: No evidence of wall thickening, mass, or obstruction. Colonic diverticulosis is noted, however there is no evidence of diverticulitis. Normal appendix visualized.  Vascular/Lymphatic: No pathologically enlarged lymph nodes identified. Recanalization of periumbilical veins and mild upper abdominal venous collaterals are suspicious for portal venous hypertension. No abdominal aortic aneurysm or other significant retroperitoneal abnormality demonstrated.  Reproductive:  No mass or other significant abnormality identified.  Other:  None.  Musculoskeletal:  No suspicious bone lesions identified.  IMPRESSION: No acute findings.  Hepatic steatosis, and probable hepatic cirrhosis with portal venous hypertension.  7 mm hypervascular lesion in the posterior dome of right hepatic lobe. Imaging follow-up is recommended in 6 months, preferably with abdomen MRI without and with  Eovist contrast. This recommendation follows ACR consensus guidelines: Managing Incidental Findings on Abdominal CT: White Paper of the ACR Incidental Findings Committee. J Am Coll Radiol 0233;4:356-861  Tiny gallstones versus gallbladder sludge. No evidence of cholecystitis.  Colonic diverticulosis. No radiographic evidence of diverticulitis.   Electronically Signed   By: Earle Gell M.D.   On: 02/12/2015 17:16   I have personally reviewed and evaluated these images and lab results as part of my medical decision-making.   EKG Interpretation None      MDM   Final diagnoses:  Cirrhosis of liver without  ascites, unspecified hepatic cirrhosis type  Liver nodule  Calculus of gallbladder without cholecystitis without obstruction    patient with epigastric and left upper quadrant lower left lower quadrant abdominal pain. Pain for a week and improving. Patient's labs already back, ordered in triage. His LFTs are elevated, history of the same, most likely due to alcohol use. He has no right upper quadrant tenderness, do not think that this is related to her gallbladder. Differential includes gastritis, peptic ulcer disease, pancreatitis, colitis. Patient's lipase is normal. Given his diarrhea, will get a CT scan to rule out colitis. Vital signs are stable at this time, blood pressure slightly elevated otherwise normal vital signs. Patient does not appear to be in any distress.   Pt's CT showing cirrhosis of the liver. Also showing a 57mm lesion in right hepatic lobe. Discussed with pt, aware. Tiny gallstones vs sludge seen, no signs of cholecystitis. Plan to dc home. Will add Carafate, patient already taking Prilosec. Instructed to stop drinking alcohol. Follow with primary care doctor for further evaluation and treatment. Return precautions discussed.  Jeannett Senior, PA-C 02/13/15 [place], MD 02/13/15 [phone]

## 2015-02-12 NOTE — ED Notes (Signed)
Bed: RZ73 Expected date:  Expected time:  Means of arrival:  Comments: No monitor

## 2015-02-12 NOTE — Discharge Instructions (Signed)
Continue omeprazole. Carafate as prescribed. Please follow up with primary care doctor for further evaluation of your liver cirrhosis, gall bladder stones, liver cyst.   Cirrhosis Cirrhosis is a condition of scarring of the liver which is caused when the liver has tried repairing itself following damage. This damage may come from a previous infection such as one of the forms of hepatitis (usually hepatitis C), or the damage may come from being injured by toxins. The main toxin that causes this damage is alcohol. The scarring of the liver from use of alcohol is irreversible. That means the liver cannot return to normal even though alcohol is not used any more. The main danger of hepatitis C infection is that it may cause long-lasting (chronic) liver disease, and this also may lead to cirrhosis. This complication is progressive and irreversible. CAUSES  Prior to available blood tests, hepatitis C could be contracted by blood transfusions. Since testing of blood has improved, this is now unlikely. This infection can also be contracted through intravenous drug use and the sharing of needles. It can also be contracted through sexual relationships. The injury caused by alcohol comes from too much use. It is not a few drinks that poison the liver, but years of misuse. Usually there will be some signs and symptoms early with scarring of the liver that suggest the development of better habits. Alcohol should never be used while using acetaminophen. A small dose of both taken together may cause irreversible damage to the liver. HOME CARE INSTRUCTIONS  There is no specific treatment for cirrhosis. However, there are things you can do to avoid making the condition worse.  Rest as needed.  Eat a well-balanced diet. Your caregiver can help you with suggestions.  Vitamin supplements including vitamins A, K, D, and thiamine can help.  A low-salt diet, water restriction, or diuretic medicine may be needed to reduce  fluid retention.  Avoid alcohol. This can be extremely toxic if combined with acetaminophen.  Avoid drugs which are toxic to the liver. Some of these include isoniazid, methyldopa, acetaminophen, anabolic steroids (muscle-building drugs), erythromycin, and oral contraceptives (birth control pills). Check with your caregiver to make sure medicines you are presently taking will not be harmful.  Periodic blood tests may be required. Follow your caregiver's advice regarding the timing of these.  Milk thistle is an herbal remedy which does protect the liver against toxins. However, it will not help once the liver has been scarred. SEEK MEDICAL CARE IF:  You have increasing fatigue or weakness.  You develop swelling of the hands, feet, legs, or face.  You vomit bright red blood, or a coffee ground appearing material.  You have blood in your stools, or the stools turn black and tarry.  You have a fever.  You develop loss of appetite, or have nausea and vomiting.  You develop jaundice.  You develop easy bruising or bleeding.  You have worsening of any of the problems you are concerned about. Document Released: 06/14/2005 Document Revised: 09/06/2011 Document Reviewed: 01/31/2008 [facility] Patient Information 2015 [place], [place]. This information is not intended to replace advice given to you by your health care provider. Make sure you discuss any questions you have with your health care provider.

## 2015-03-20 ENCOUNTER — Other Ambulatory Visit: Payer: Self-pay | Admitting: Family Medicine

## 2015-03-20 DIAGNOSIS — R160 Hepatomegaly, not elsewhere classified: Secondary | ICD-10-CM

## 2015-03-26 ENCOUNTER — Ambulatory Visit
Admission: RE | Admit: 2015-03-26 | Discharge: 2015-03-26 | Disposition: A | Discharge status: Home / Self Care | Payer: 59 | Source: Ambulatory Visit | Attending: Family Medicine | Admitting: Family Medicine

## 2015-03-26 DIAGNOSIS — R160 Hepatomegaly, not elsewhere classified: Secondary | ICD-10-CM

## 2015-03-26 MED ORDER — GADOBENATE DIMEGLUMINE 529 MG/ML IV SOLN
20.0000 mL | Freq: Once | INTRAVENOUS | Status: AC | PRN
  Administered 2015-03-26: 20 mL via INTRAVENOUS

## 2015-04-03 ENCOUNTER — Emergency Department (HOSPITAL_COMMUNITY): Payer: 59

## 2015-04-03 ENCOUNTER — Emergency Department (HOSPITAL_COMMUNITY)
Admission: EM | Admit: 2015-04-03 | Discharge: 2015-04-03 | Disposition: A | Discharge status: Home / Self Care | Payer: 59 | Attending: Emergency Medicine | Admitting: Emergency Medicine

## 2015-04-03 ENCOUNTER — Encounter (HOSPITAL_COMMUNITY): Payer: Self-pay | Admitting: *Deleted

## 2015-04-03 DIAGNOSIS — Z79899 Other long term (current) drug therapy: Secondary | ICD-10-CM | POA: Insufficient documentation

## 2015-04-03 DIAGNOSIS — I10 Essential (primary) hypertension: Secondary | ICD-10-CM | POA: Insufficient documentation

## 2015-04-03 DIAGNOSIS — R1011 Right upper quadrant pain: Secondary | ICD-10-CM | POA: Diagnosis not present

## 2015-04-03 DIAGNOSIS — M25511 Pain in right shoulder: Secondary | ICD-10-CM | POA: Insufficient documentation

## 2015-04-03 DIAGNOSIS — Z8639 Personal history of other endocrine, nutritional and metabolic disease: Secondary | ICD-10-CM | POA: Diagnosis not present

## 2015-04-03 DIAGNOSIS — R1084 Generalized abdominal pain: Secondary | ICD-10-CM | POA: Diagnosis present

## 2015-04-03 DIAGNOSIS — K219 Gastro-esophageal reflux disease without esophagitis: Secondary | ICD-10-CM | POA: Insufficient documentation

## 2015-04-03 DIAGNOSIS — Z792 Long term (current) use of antibiotics: Secondary | ICD-10-CM | POA: Insufficient documentation

## 2015-04-03 DIAGNOSIS — R109 Unspecified abdominal pain: Secondary | ICD-10-CM

## 2015-04-03 LAB — COMPREHENSIVE METABOLIC PANEL
ALT: 157 U/L — ABNORMAL HIGH (ref 17–63)
ANION GAP: 10 (ref 5–15)
AST: 194 U/L — ABNORMAL HIGH (ref 15–41)
Albumin: 3.7 g/dL (ref 3.5–5.0)
Alkaline Phosphatase: 157 U/L — ABNORMAL HIGH (ref 38–126)
BUN: 6 mg/dL (ref 6–20)
CHLORIDE: 98 mmol/L — AB (ref 101–111)
CO2: 28 mmol/L (ref 22–32)
Calcium: 9.2 mg/dL (ref 8.9–10.3)
Creatinine, Ser: 0.62 mg/dL (ref 0.61–1.24)
Glucose, Bld: 147 mg/dL — ABNORMAL HIGH (ref 65–99)
POTASSIUM: 3.5 mmol/L (ref 3.5–5.1)
Sodium: 136 mmol/L (ref 135–145)
Total Bilirubin: 1 mg/dL (ref 0.3–1.2)
Total Protein: 7.6 g/dL (ref 6.5–8.1)

## 2015-04-03 LAB — CBC WITH DIFFERENTIAL/PLATELET
BASOS ABS: 0 10*3/uL (ref 0.0–0.1)
BASOS PCT: 0 %
EOS ABS: 0.4 10*3/uL (ref 0.0–0.7)
Eosinophils Relative: 4 %
HEMATOCRIT: 45.2 % (ref 39.0–52.0)
HEMOGLOBIN: 15.9 g/dL (ref 13.0–17.0)
Lymphocytes Relative: 34 %
Lymphs Abs: 3.7 10*3/uL (ref 0.7–4.0)
MCH: 37.4 pg — ABNORMAL HIGH (ref 26.0–34.0)
MCHC: 35.2 g/dL (ref 30.0–36.0)
MCV: 106.4 fL — ABNORMAL HIGH (ref 78.0–100.0)
MONOS PCT: 9 %
Monocytes Absolute: 1 10*3/uL (ref 0.1–1.0)
NEUTROS ABS: 5.7 10*3/uL (ref 1.7–7.7)
NEUTROS PCT: 53 %
Platelets: 180 10*3/uL (ref 150–400)
RBC: 4.25 MIL/uL (ref 4.22–5.81)
RDW: 12.8 % (ref 11.5–15.5)
WBC: 10.8 10*3/uL — AB (ref 4.0–10.5)

## 2015-04-03 LAB — I-STAT TROPONIN, ED: TROPONIN I, POC: 0 ng/mL (ref 0.00–0.08)

## 2015-04-03 LAB — LIPASE, BLOOD: LIPASE: 32 U/L (ref 22–51)

## 2015-04-03 MED ORDER — HYDROMORPHONE HCL 1 MG/ML IJ SOLN
1.0000 mg | Freq: Once | INTRAMUSCULAR | Status: AC
  Administered 2015-04-03: 1 mg via INTRAVENOUS
  Filled 2015-04-03: qty 1

## 2015-04-03 MED ORDER — ONDANSETRON HCL 4 MG/2ML IJ SOLN
4.0000 mg | Freq: Once | INTRAMUSCULAR | Status: AC
  Administered 2015-04-03: 4 mg via INTRAVENOUS
  Filled 2015-04-03: qty 2

## 2015-04-03 MED ORDER — PANTOPRAZOLE SODIUM 40 MG IV SOLR
40.0000 mg | Freq: Once | INTRAVENOUS | Status: AC
  Administered 2015-04-03: 40 mg via INTRAVENOUS
  Filled 2015-04-03: qty 40

## 2015-04-03 MED ORDER — ONDANSETRON 4 MG PO TBDP: ORAL_TABLET | ORAL | Status: DC

## 2015-04-03 MED ORDER — OXYCODONE HCL 5 MG PO TABS: 5.0000 mg | ORAL_TABLET | Freq: Four times a day (QID) | ORAL | Status: DC | PRN

## 2015-04-03 NOTE — Discharge Instructions (Signed)
Follow up with your md as planned next week.  Return here if problems this weekend

## 2015-04-03 NOTE — ED Provider Notes (Signed)
CSN: [id number]     Arrival date & time 04/03/15  1519 History   First MD Initiated Contact with Patient 04/03/15 1522     Chief Complaint  Patient presents with  . Chest Pain  . Abdominal Pain     (Consider location/radiation/quality/duration/timing/severity/associated sxs/prior Treatment) Patient is a 54 y.o. male presenting with abdominal pain. The history is provided by the patient (The patient complains of abdominal pain and some right shoulder pain today. The pain is severe. His father passed away today in the hospital. Patient has history of cirrhosis and is scheduled to see a general surgeon and a GI physician.).  Abdominal Pain Pain location:  Generalized Pain quality: aching   Pain radiates to:  Does not radiate Pain severity:  Moderate Onset quality:  Sudden Timing:  Constant Progression:  Waxing and waning Chronicity:  New Context: alcohol use   Associated symptoms: no chest pain, no cough, no diarrhea, no fatigue and no hematuria     Past Medical History  Diagnosis Date  . Hypertension   . Hyperlipidemia   . GERD (gastroesophageal reflux disease)    Past Surgical History  Procedure Laterality Date  . Shoulder arthroscopy    . Vasectomy     Family History  Problem Relation Age of Onset  . Hypertension Mother   . Diabetes Father   . Arthritis Father   . Heart disease Father    Social History  Substance Use Topics  . Smoking status: Never Smoker   . Smokeless tobacco: None  . Alcohol Use: Yes    Review of Systems  Constitutional: Negative for appetite change and fatigue.  HENT: Negative for congestion, ear discharge and sinus pressure.   Eyes: Negative for discharge.  Respiratory: Negative for cough.   Cardiovascular: Negative for chest pain.  Gastrointestinal: Positive for abdominal pain. Negative for diarrhea.  Genitourinary: Negative for frequency and hematuria.  Musculoskeletal: Negative for back pain.  Skin: Negative for rash.  Neurological:  Negative for seizures and headaches.  Psychiatric/Behavioral: Negative for hallucinations.      Allergies  Shellfish allergy  Home Medications   Prior to Admission medications   Medication Sig Start Date End Date Taking? Authorizing Provider  ALPRAZolam Duanne Moron) 1 MG tablet Take 1 mg by mouth 3 (three) times daily as needed. For anxiety 07/17/13   Historical Provider, MD  ampicillin (PRINCIPEN) 500 MG capsule Take 500 mg by mouth 2 (two) times daily.    Historical Provider, MD  B Complex-C (B-COMPLEX WITH VITAMIN C) tablet Take 1 tablet by mouth daily.    Historical Provider, MD  escitalopram (LEXAPRO) 20 MG tablet Take 20 mg by mouth daily. 01/16/15   Historical Provider, MD  fish oil-omega-3 fatty acids 1000 MG capsule Take 3 g by mouth daily.     Historical Provider, MD  Lysine 500 MG CAPS Take 1,000 mg by mouth daily.     Historical Provider, MD  Multiple Vitamins-Minerals (MULTIVITAMIN WITH MINERALS) tablet Take 1 tablet by mouth daily.    Historical Provider, MD  Nebivolol HCl 20 MG TABS Take 40 mg by mouth daily.    Historical Provider, MD  omeprazole (PRILOSEC) 40 MG capsule Take 40 mg by mouth daily. 05/15/13   Historical Provider, MD  sucralfate (CARAFATE) 1 G tablet Take 1 tablet (1 g total) by mouth 4 (four) times daily -  with meals and at bedtime. 02/12/15   Tatyana Kirichenko, PA-C  traZODone (DESYREL) 100 MG tablet Take 100 mg by mouth at bedtime. 01/07/15  Historical Provider, MD   BP 185/96 mmHg  Pulse 89  Resp 26  SpO2 95% Physical Exam  Constitutional: He is oriented to person, place, and time. He appears well-developed.  HENT:  Head: Normocephalic.  Eyes: Conjunctivae and EOM are normal. No scleral icterus.  Neck: Neck supple. No thyromegaly present.  Cardiovascular: Normal rate and regular rhythm.  Exam reveals no gallop and no friction rub.   No murmur heard. Pulmonary/Chest: No stridor. He has no wheezes. He has no rales. He exhibits no tenderness.   Abdominal: He exhibits no distension. There is tenderness. There is no rebound.  Tender ruq  Musculoskeletal: Normal range of motion. He exhibits no edema.  Lymphadenopathy:    He has no cervical adenopathy.  Neurological: He is oriented to person, place, and time. He exhibits normal muscle tone. Coordination normal.  Skin: No rash noted. No erythema.  Psychiatric: He has a normal mood and affect. His behavior is normal.    ED Course  Procedures (including critical care time) Labs Review Labs Reviewed  CBC WITH DIFFERENTIAL/PLATELET - Abnormal; Notable for the following:    WBC 10.8 (*)    MCV 106.4 (*)    MCH 37.4 (*)    All other components within normal limits  COMPREHENSIVE METABOLIC PANEL - Abnormal; Notable for the following:    Chloride 98 (*)    Glucose, Bld 147 (*)    AST 194 (*)    ALT 157 (*)    Alkaline Phosphatase 157 (*)    All other components within normal limits  LIPASE, BLOOD  I-STAT TROPOININ, ED    Imaging Review Dg Chest Port 1 View  04/03/2015   CLINICAL DATA:  Chest pain  EXAM: PORTABLE CHEST 1 VIEW  COMPARISON:  September 11, 2008  FINDINGS: There is no edema or consolidation. The heart size and pulmonary vascularity normal. No adenopathy. No bone lesions. No pneumothorax.  IMPRESSION: No edema or consolidation.   Electronically Signed   By: Lowella Grip III M.D.   On: 04/03/2015 16:01   I have personally reviewed and evaluated these images and lab results as part of my medical decision-making.   EKG Interpretation   Date/Time:  Thursday April 03 2015 15:23:28 EDT Ventricular Rate:  91 PR Interval:  182 QRS Duration: 97 QT Interval:  379 QTC Calculation: 466 R Axis:   60 Text Interpretation:  Sinus rhythm Probable left atrial enlargement  Anterior infarct, old Confirmed by Mayar Whittier  MD, Jamya Starry [phone]) on 04/03/2015  5:57:18 PM      MDM   Final diagnoses:  None    Abdominal pain most likely secondary to cirrhosis. Labs are  unremarkable except for elevated liver studies. Doubt any of this pain is related to coronary artery disease. Patient improved with Dilaudid. He will be sent home with Vicodin and Zofran instructed to follow-up with his doctors next week as planned and return here this weekend if symptoms get worse.[place], MD 04/03/15 [phone]

## 2015-04-03 NOTE — ED Notes (Signed)
See triage note.

## 2015-04-03 NOTE — ED Notes (Signed)
Bed: WA13 Expected date:  Expected time:  Means of arrival:  Comments: hold 

## 2015-04-03 NOTE — ED Notes (Signed)
Pt reports R upper chest pain radiating to his neck and down to his RLQ x 2 days.  Pt describes pain as sharp pain which is worse when taking a deep breath.  Pt reports pain started x 2 days ago and recurred today.  Pt reports SOB and nausea.  Pt also reports recent dx of gallstones.  Pt is diaphoretic at present.

## 2015-04-03 NOTE — ED Notes (Signed)
Patient was educated not to drive, operate heavy machinery, or drink alcohol while taking narcotic medication.  

## 2015-04-10 ENCOUNTER — Ambulatory Visit: Payer: 59 | Admitting: *Deleted

## 2015-04-11 ENCOUNTER — Other Ambulatory Visit: Payer: Self-pay | Admitting: General Surgery

## 2015-04-11 DIAGNOSIS — K819 Cholecystitis, unspecified: Secondary | ICD-10-CM

## 2015-04-14 ENCOUNTER — Other Ambulatory Visit (HOSPITAL_COMMUNITY): Payer: Self-pay | Admitting: Gastroenterology

## 2015-04-14 DIAGNOSIS — K769 Liver disease, unspecified: Secondary | ICD-10-CM

## 2015-04-23 ENCOUNTER — Ambulatory Visit (HOSPITAL_COMMUNITY)
Admission: RE | Admit: 2015-04-23 | Discharge: 2015-04-23 | Disposition: A | Discharge status: Home / Self Care | Payer: 59 | Source: Ambulatory Visit | Attending: General Surgery | Admitting: General Surgery

## 2015-04-23 DIAGNOSIS — K819 Cholecystitis, unspecified: Secondary | ICD-10-CM | POA: Diagnosis not present

## 2015-04-23 MED ORDER — SINCALIDE 5 MCG IJ SOLR
0.0200 ug/kg | Freq: Once | INTRAMUSCULAR | Status: AC
  Administered 2015-04-23: 2.45 ug via INTRAVENOUS

## 2015-04-23 MED ORDER — TECHNETIUM TC 99M MEBROFENIN IV KIT
5.4000 | PACK | Freq: Once | INTRAVENOUS | Status: DC | PRN
  Administered 2015-04-23: 5 via INTRAVENOUS
  Filled 2015-04-23: qty 6

## 2015-04-23 MED ORDER — SINCALIDE 5 MCG IJ SOLR
INTRAMUSCULAR | Status: AC
  Administered 2015-04-23: 2.45 ug via INTRAVENOUS
  Filled 2015-04-23: qty 10

## 2015-04-23 MED ORDER — STERILE WATER FOR INJECTION IJ SOLN
INTRAMUSCULAR | Status: AC
  Administered 2015-04-23: 10 mL
  Filled 2015-04-23: qty 10

## 2015-05-06 ENCOUNTER — Ambulatory Visit: Payer: 59

## 2015-05-12 ENCOUNTER — Other Ambulatory Visit: Payer: Self-pay | Admitting: General Surgery

## 2015-05-15 ENCOUNTER — Other Ambulatory Visit: Payer: Self-pay | Admitting: General Surgery

## 2015-06-02 ENCOUNTER — Other Ambulatory Visit: Payer: Self-pay | Admitting: Gastroenterology

## 2015-06-02 DIAGNOSIS — K769 Liver disease, unspecified: Secondary | ICD-10-CM

## 2015-06-04 ENCOUNTER — Ambulatory Visit
Admission: RE | Admit: 2015-06-04 | Discharge: 2015-06-04 | Disposition: A | Discharge status: Home / Self Care | Payer: 59 | Source: Ambulatory Visit | Attending: Gastroenterology | Admitting: Gastroenterology

## 2015-06-04 DIAGNOSIS — K769 Liver disease, unspecified: Secondary | ICD-10-CM

## 2015-06-04 MED ORDER — GADOXETATE DISODIUM 0.25 MMOL/ML IV SOLN
10.0000 mL | Freq: Once | INTRAVENOUS | Status: AC | PRN
  Administered 2015-06-04: 10 mL via INTRAVENOUS

## 2015-06-13 NOTE — Pre-Procedure Instructions (Addendum)
SOVEREIGN YAMANE  06/13/2015      [id number] [place] - [address] [place] [address] Phone: [phone] Fax: [phone]  [place], [place] [address] Phone: [phone] Fax: [phone]    Your procedure is scheduled on Tuesday, June 24, 2015  Report to [facility] Admitting at 8:30 A.M.  Call this number if you have problems the morning of surgery:  [phone]   Remember:  Do not eat food or drink liquids after midnight Monday, June 23, 2015  Take these medicines the morning of surgery with A SIP OF WATER, escitalopram (LEXAPRO), omeprazole (PRILOSEC), if needed:ALPRAZolam Duanne Moron) for anxiety, oxyCODONE (ROXICODONE) for pain  Stop taking Aspirin, fish oil, vitamins(B, , multi,) and herbal medications such as Lysine Do not take any NSAIDs ie: Ibuprofen, Advil, Naproxen or any medication containing Aspirin; stop thurs December22 2016   Do not wear jewelry, make-up or nail polish.  Do not wear lotions, powders, or perfumes.  You may not wear deodorant.  Do not shave 48 hours prior to surgery.  Men may shave face and neck.  Do not bring valuables to the hospital.  [facility] is not responsible for any belongings or valuables.  Contacts, dentures or bridgework may not be worn into surgery.  Leave your suitcase in the car.  After surgery it may be brought to your room.  For patients admitted to the hospital, discharge time will be determined by your treatment team.  Patients discharged the day of surgery will not be allowed to drive home.   Name and phone number of your driver:   Special instructions:  Special Instructions: [facility] - Preparing for Surgery  Before surgery, you can play an important role.  Because skin is not sterile, your skin needs to be as free of germs as possible.  You can reduce the number of germs on you  skin by washing with CHG (chlorahexidine gluconate) soap before surgery.  CHG is an antiseptic cleaner which kills germs and bonds with the skin to continue killing germs even after washing.  Please DO NOT use if you have an allergy to CHG or antibacterial soaps.  If your skin becomes reddened/irritated stop using the CHG and inform your nurse when you arrive at Short Stay.  Do not shave (including legs and underarms) for at least 48 hours prior to the first CHG shower.  You may shave your face.  Please follow these instructions carefully:   1.  Shower with CHG Soap the night before surgery and the morning of Surgery.  2.  If you choose to wash your hair, wash your hair first as usual with your normal shampoo.  3.  After you shampoo, rinse your hair and body thoroughly to remove the Shampoo.  4.  Use CHG as you would any other liquid soap.  You can apply chg directly  to the skin and wash gently with scrungie or a clean washcloth.  5.  Apply the CHG Soap to your body ONLY FROM THE NECK DOWN.  Do not use on open wounds or open sores.  Avoid contact with your eyes ears, mouth and genitals (private parts).  Wash genitals (private parts)       with your normal soap.  6.  Wash thoroughly, paying special attention to the area where your surgery will be performed.  7.  Thoroughly  rinse your body with warm water from the neck down.  8.  DO NOT shower/wash with your normal soap after using and rinsing off the CHG Soap.  9.  Pat yourself dry with a clean towel.            10.  Wear clean pajamas.            11.  Place clean sheets on your bed the night of your first shower and do not sleep with pets.  Day of Surgery  Do not apply any lotions/deodorants the morning of surgery.  Please wear clean clothes to the hospital/surgery center..  Please read over the following fact sheets that you were given. Pain Booklet, Coughing and Deep Breathing and Surgical Site Infection Prevention

## 2015-06-16 ENCOUNTER — Encounter (HOSPITAL_COMMUNITY)
Admission: RE | Admit: 2015-06-16 | Discharge: 2015-06-16 | Disposition: A | Discharge status: Home / Self Care | Payer: 59 | Source: Ambulatory Visit | Attending: General Surgery | Admitting: General Surgery

## 2015-06-16 ENCOUNTER — Encounter (HOSPITAL_COMMUNITY): Payer: Self-pay

## 2015-06-16 DIAGNOSIS — E785 Hyperlipidemia, unspecified: Secondary | ICD-10-CM | POA: Diagnosis not present

## 2015-06-16 DIAGNOSIS — F1010 Alcohol abuse, uncomplicated: Secondary | ICD-10-CM | POA: Insufficient documentation

## 2015-06-16 DIAGNOSIS — F419 Anxiety disorder, unspecified: Secondary | ICD-10-CM | POA: Diagnosis not present

## 2015-06-16 DIAGNOSIS — I10 Essential (primary) hypertension: Secondary | ICD-10-CM | POA: Diagnosis not present

## 2015-06-16 DIAGNOSIS — K219 Gastro-esophageal reflux disease without esophagitis: Secondary | ICD-10-CM | POA: Diagnosis not present

## 2015-06-16 DIAGNOSIS — Z01812 Encounter for preprocedural laboratory examination: Secondary | ICD-10-CM | POA: Insufficient documentation

## 2015-06-16 DIAGNOSIS — Z79899 Other long term (current) drug therapy: Secondary | ICD-10-CM | POA: Diagnosis not present

## 2015-06-16 DIAGNOSIS — Z01818 Encounter for other preprocedural examination: Secondary | ICD-10-CM | POA: Insufficient documentation

## 2015-06-16 DIAGNOSIS — K811 Chronic cholecystitis: Secondary | ICD-10-CM | POA: Diagnosis not present

## 2015-06-16 HISTORY — DX: Alcohol abuse, in remission: F10.11

## 2015-06-16 HISTORY — DX: Anxiety disorder, unspecified: F41.9

## 2015-06-16 HISTORY — DX: Dizziness and giddiness: R42

## 2015-06-16 LAB — URINALYSIS, ROUTINE W REFLEX MICROSCOPIC
BILIRUBIN URINE: NEGATIVE
GLUCOSE, UA: NEGATIVE mg/dL
Hgb urine dipstick: NEGATIVE
KETONES UR: NEGATIVE mg/dL
Leukocytes, UA: NEGATIVE
NITRITE: NEGATIVE
PH: 6.5 (ref 5.0–8.0)
PROTEIN: NEGATIVE mg/dL
Specific Gravity, Urine: 1.019 (ref 1.005–1.030)

## 2015-06-16 LAB — CBC WITH DIFFERENTIAL/PLATELET
Basophils Absolute: 0.1 K/uL (ref 0.0–0.1)
Basophils Relative: 1 %
Eosinophils Absolute: 0.4 K/uL (ref 0.0–0.7)
Eosinophils Relative: 8 %
HCT: 47.5 % (ref 39.0–52.0)
Hemoglobin: 16.1 g/dL (ref 13.0–17.0)
Lymphocytes Relative: 47 %
Lymphs Abs: 2.4 K/uL (ref 0.7–4.0)
MCH: 35.7 pg — ABNORMAL HIGH (ref 26.0–34.0)
MCHC: 33.9 g/dL (ref 30.0–36.0)
MCV: 105.3 fL — ABNORMAL HIGH (ref 78.0–100.0)
Monocytes Absolute: 0.5 K/uL (ref 0.1–1.0)
Monocytes Relative: 9 %
Neutro Abs: 1.8 K/uL (ref 1.7–7.7)
Neutrophils Relative %: 35 %
Platelets: 125 K/uL — ABNORMAL LOW (ref 150–400)
RBC: 4.51 MIL/uL (ref 4.22–5.81)
RDW: 13.3 % (ref 11.5–15.5)
WBC: 5.2 K/uL (ref 4.0–10.5)

## 2015-06-16 LAB — PROTIME-INR
INR: 1.12 (ref 0.00–1.49)
PROTHROMBIN TIME: 14.5 s (ref 11.6–15.2)

## 2015-06-16 LAB — COMPREHENSIVE METABOLIC PANEL WITH GFR
ALT: 96 U/L — ABNORMAL HIGH (ref 17–63)
AST: 206 U/L — ABNORMAL HIGH (ref 15–41)
Albumin: 3.9 g/dL (ref 3.5–5.0)
Alkaline Phosphatase: 139 U/L — ABNORMAL HIGH (ref 38–126)
Anion gap: 13 (ref 5–15)
BUN: <5 mg/dL — ABNORMAL LOW (ref 6–20)
CO2: 26 mmol/L (ref 22–32)
Calcium: 9.7 mg/dL (ref 8.9–10.3)
Chloride: 103 mmol/L (ref 101–111)
Creatinine, Ser: 0.56 mg/dL — ABNORMAL LOW (ref 0.61–1.24)
GFR calc Af Amer: >60 mL/min
GFR calc non Af Amer: >60 mL/min
Glucose, Bld: 143 mg/dL — ABNORMAL HIGH (ref 65–99)
Potassium: 3.4 mmol/L — ABNORMAL LOW (ref 3.5–5.1)
Sodium: 142 mmol/L (ref 135–145)
Total Bilirubin: 1.1 mg/dL (ref 0.3–1.2)
Total Protein: 7.1 g/dL (ref 6.5–8.1)

## 2015-06-16 LAB — NO BLOOD PRODUCTS

## 2015-06-17 ENCOUNTER — Encounter (HOSPITAL_COMMUNITY): Payer: Self-pay

## 2015-06-17 NOTE — Progress Notes (Signed)
Anesthesia Chart Review: Patient is a 54 year old male scheduled for laparoscopic cholecystectomy on 06/24/15 by Dr. Barry Dienes.  PCP is Dr. Jonathon Jordan, just seen 06/16/15. GI is Dr. Michail Sermon (elevated LFTs, steatohepatitis).  History includes non-smoker, HTN, HLD, GERD, anxiety, vertigo, non-smoker. History of alcohol abuse (admits to drinking fifth of vodka/week; per PCP notes was sober for 45 days but started drinking again last week but plans to quit again before surgery). BMI is consistent with obesity.   PAT Vitals: BP 135/84, HR 77, RR 20, O2 sat 97%, T 37.1C.  Meds include Xanax, Tenoretic, Lexapro, Lysine, fish oil, Prilosec, oxycodone, Cialis, trazodone.   04/03/15 EKG: SR, possible LAE.  08/30/08 Nuclear Stress Test [facility]-done for evaluation of chest pain; read by Dr. Irish Lack): Normal myocardial perfusion scan demonstrating an attenuation artifact in the inferior region of the myocardium. No ischemia or infarct/scar is seen in the remaining myocardium.   06/16/15 Liver MRI: IMPRESSION: 1. Stable 7 mm hypervascular lesion in the posterior hepatic dome. Hepatocyte phase imaging suggests the presence of hepatocytes within this lesion although the tiny size makes definitive assessment difficult. Statistically, this is most likely a benign finding and the 2.5 months of stability is reassuring. Repeat MRI could be performed in 6 months to ensure that this does not change. 2. Left hepatic lobe and caudate lobe remain prominent although no other morphologic features of cirrhosis are evident. 3. Diffuse geographic fatty deposition in the liver parenchyma.  04/03/15 PCXR: IMPRESSION: No edema or consolidation.  Preoperative labs noted. Cr 0.56. Glucose 143. AST/ALT 206/96 (prevously AST 194-212 and ALT 117-157 since 01/2015). H/H 16.1/47.5. PLT 125K. PT/INR. He requested TRANSFUSE NO BLOOD ([employer]). A1C 5.8 (Dr. Stephanie Acre' office). Dr. Myer Peer notes mention a previously  negative viral hepatitis panel (date 2013 ?). Labs discussed with anesthesiologist Dr. Jenita Seashore. I will not plan to repeat labs prior to surgery since LFTs have been stable since at least August, and he has been evaluated by DI in the past and has had recent PCP follow-up.   George Hugh [facility] Short Stay Center/Anesthesiology Phone [phone] 06/17/2015 4:29 PM

## 2015-06-23 MED ORDER — CEFAZOLIN SODIUM-DEXTROSE 2-3 GM-% IV SOLR
2.0000 g | INTRAVENOUS | Status: AC
  Administered 2015-06-24: 2 g via INTRAVENOUS
  Filled 2015-06-23: qty 50

## 2015-06-24 ENCOUNTER — Encounter (HOSPITAL_COMMUNITY): Payer: Self-pay | Admitting: *Deleted

## 2015-06-24 ENCOUNTER — Encounter (HOSPITAL_COMMUNITY)
Admission: RE | Disposition: A | Discharge status: Home / Self Care | Payer: Self-pay | Source: Ambulatory Visit | Attending: General Surgery

## 2015-06-24 ENCOUNTER — Ambulatory Visit (HOSPITAL_COMMUNITY)
Admission: RE | Admit: 2015-06-24 | Discharge: 2015-06-24 | Disposition: A | Discharge status: Home / Self Care | Payer: 59 | Source: Ambulatory Visit | Attending: General Surgery | Admitting: General Surgery

## 2015-06-24 ENCOUNTER — Ambulatory Visit (HOSPITAL_COMMUNITY): Payer: 59 | Admitting: Vascular Surgery

## 2015-06-24 ENCOUNTER — Ambulatory Visit (HOSPITAL_COMMUNITY): Payer: 59 | Admitting: Anesthesiology

## 2015-06-24 DIAGNOSIS — K819 Cholecystitis, unspecified: Secondary | ICD-10-CM | POA: Diagnosis present

## 2015-06-24 DIAGNOSIS — I10 Essential (primary) hypertension: Secondary | ICD-10-CM | POA: Insufficient documentation

## 2015-06-24 DIAGNOSIS — F419 Anxiety disorder, unspecified: Secondary | ICD-10-CM | POA: Diagnosis not present

## 2015-06-24 DIAGNOSIS — E785 Hyperlipidemia, unspecified: Secondary | ICD-10-CM | POA: Diagnosis not present

## 2015-06-24 DIAGNOSIS — Z79899 Other long term (current) drug therapy: Secondary | ICD-10-CM | POA: Diagnosis not present

## 2015-06-24 DIAGNOSIS — Z8249 Family history of ischemic heart disease and other diseases of the circulatory system: Secondary | ICD-10-CM | POA: Diagnosis not present

## 2015-06-24 DIAGNOSIS — K219 Gastro-esophageal reflux disease without esophagitis: Secondary | ICD-10-CM | POA: Insufficient documentation

## 2015-06-24 DIAGNOSIS — K811 Chronic cholecystitis: Secondary | ICD-10-CM | POA: Diagnosis not present

## 2015-06-24 DIAGNOSIS — IMO0001 Reserved for inherently not codable concepts without codable children: Secondary | ICD-10-CM | POA: Diagnosis present

## 2015-06-24 DIAGNOSIS — Z789 Other specified health status: Secondary | ICD-10-CM | POA: Diagnosis present

## 2015-06-24 HISTORY — DX: Reserved for inherently not codable concepts without codable children: IMO0001

## 2015-06-24 HISTORY — DX: Other specified health status: Z78.9

## 2015-06-24 HISTORY — PX: CHOLECYSTECTOMY: SHX55

## 2015-06-24 SURGERY — LAPAROSCOPIC CHOLECYSTECTOMY WITH INTRAOPERATIVE CHOLANGIOGRAM
Anesthesia: General | Site: Abdomen

## 2015-06-24 MED ORDER — GLYCOPYRROLATE 0.2 MG/ML IJ SOLN
INTRAMUSCULAR | Status: AC
  Filled 2015-06-24: qty 3

## 2015-06-24 MED ORDER — ONDANSETRON HCL 4 MG/2ML IJ SOLN
INTRAMUSCULAR | Status: AC
  Filled 2015-06-24: qty 4

## 2015-06-24 MED ORDER — HYDROMORPHONE HCL 1 MG/ML IJ SOLN
INTRAMUSCULAR | Status: AC
  Filled 2015-06-24: qty 1

## 2015-06-24 MED ORDER — PROPOFOL 10 MG/ML IV BOLUS
INTRAVENOUS | Status: AC
  Filled 2015-06-24: qty 40

## 2015-06-24 MED ORDER — LIDOCAINE HCL 1 % IJ SOLN
INTRAMUSCULAR | Status: DC | PRN
  Administered 2015-06-24: 8 mL via INTRAMUSCULAR

## 2015-06-24 MED ORDER — DEXAMETHASONE SODIUM PHOSPHATE 4 MG/ML IJ SOLN
INTRAMUSCULAR | Status: DC | PRN
  Administered 2015-06-24: 4 mg via INTRAVENOUS

## 2015-06-24 MED ORDER — OXYCODONE HCL 5 MG PO TABS: 5.0000 mg | ORAL_TABLET | Freq: Once | ORAL | Status: DC | PRN

## 2015-06-24 MED ORDER — EPHEDRINE SULFATE 50 MG/ML IJ SOLN
INTRAMUSCULAR | Status: AC
  Filled 2015-06-24: qty 1

## 2015-06-24 MED ORDER — LIDOCAINE HCL (CARDIAC) 20 MG/ML IV SOLN
INTRAVENOUS | Status: AC
  Filled 2015-06-24: qty 5

## 2015-06-24 MED ORDER — OXYCODONE HCL 5 MG PO TABS: 5.0000 mg | ORAL_TABLET | Freq: Four times a day (QID) | ORAL | Status: DC | PRN

## 2015-06-24 MED ORDER — SODIUM CHLORIDE 0.9 % IR SOLN
Status: DC | PRN
  Administered 2015-06-24: 1000 mL

## 2015-06-24 MED ORDER — ROCURONIUM BROMIDE 50 MG/5ML IV SOLN
INTRAVENOUS | Status: AC
  Filled 2015-06-24: qty 4

## 2015-06-24 MED ORDER — HYDROMORPHONE HCL 1 MG/ML IJ SOLN
0.2500 mg | INTRAMUSCULAR | Status: DC | PRN
  Administered 2015-06-24 (×3): 0.5 mg via INTRAVENOUS

## 2015-06-24 MED ORDER — LIDOCAINE HCL (PF) 1 % IJ SOLN
INTRAMUSCULAR | Status: AC
  Filled 2015-06-24: qty 30

## 2015-06-24 MED ORDER — HEMOSTATIC AGENTS (NO CHARGE) OPTIME
TOPICAL | Status: DC | PRN
  Administered 2015-06-24: 1 via TOPICAL

## 2015-06-24 MED ORDER — FENTANYL CITRATE (PF) 100 MCG/2ML IJ SOLN
INTRAMUSCULAR | Status: DC | PRN
  Administered 2015-06-24 (×5): 50 ug via INTRAVENOUS
  Administered 2015-06-24: 100 ug via INTRAVENOUS
  Administered 2015-06-24 (×3): 50 ug via INTRAVENOUS

## 2015-06-24 MED ORDER — ONDANSETRON HCL 4 MG/2ML IJ SOLN
INTRAMUSCULAR | Status: DC | PRN
  Administered 2015-06-24: 4 mg via INTRAVENOUS

## 2015-06-24 MED ORDER — BUPIVACAINE-EPINEPHRINE (PF) 0.25% -1:200000 IJ SOLN
INTRAMUSCULAR | Status: AC
  Filled 2015-06-24: qty 30

## 2015-06-24 MED ORDER — 0.9 % SODIUM CHLORIDE (POUR BTL) OPTIME
TOPICAL | Status: DC | PRN
  Administered 2015-06-24: 1000 mL

## 2015-06-24 MED ORDER — ESMOLOL HCL 100 MG/10ML IV SOLN
INTRAVENOUS | Status: AC
  Filled 2015-06-24: qty 10

## 2015-06-24 MED ORDER — OXYCODONE HCL 5 MG/5ML PO SOLN: 5.0000 mg | Freq: Once | ORAL | Status: DC | PRN

## 2015-06-24 MED ORDER — GLYCOPYRROLATE 0.2 MG/ML IJ SOLN
INTRAMUSCULAR | Status: DC | PRN
  Administered 2015-06-24: .6 mg via INTRAVENOUS

## 2015-06-24 MED ORDER — SUCCINYLCHOLINE CHLORIDE 20 MG/ML IJ SOLN
INTRAMUSCULAR | Status: AC
  Filled 2015-06-24: qty 1

## 2015-06-24 MED ORDER — SODIUM CHLORIDE 0.9 % IJ SOLN
INTRAMUSCULAR | Status: AC
  Filled 2015-06-24: qty 10

## 2015-06-24 MED ORDER — LIDOCAINE HCL (CARDIAC) 20 MG/ML IV SOLN
INTRAVENOUS | Status: DC | PRN
  Administered 2015-06-24: 60 mg via INTRAVENOUS

## 2015-06-24 MED ORDER — MIDAZOLAM HCL 2 MG/2ML IJ SOLN
INTRAMUSCULAR | Status: AC
  Filled 2015-06-24: qty 2

## 2015-06-24 MED ORDER — FENTANYL CITRATE (PF) 250 MCG/5ML IJ SOLN
INTRAMUSCULAR | Status: AC
  Filled 2015-06-24: qty 5

## 2015-06-24 MED ORDER — ROCURONIUM BROMIDE 100 MG/10ML IV SOLN
INTRAVENOUS | Status: DC | PRN
  Administered 2015-06-24: 5 mg via INTRAVENOUS
  Administered 2015-06-24: 50 mg via INTRAVENOUS

## 2015-06-24 MED ORDER — PHENYLEPHRINE HCL 10 MG/ML IJ SOLN
INTRAMUSCULAR | Status: DC | PRN
  Administered 2015-06-24: 80 ug via INTRAVENOUS
  Administered 2015-06-24 (×2): 40 ug via INTRAVENOUS

## 2015-06-24 MED ORDER — ONDANSETRON HCL 4 MG/2ML IJ SOLN: 4.0000 mg | Freq: Four times a day (QID) | INTRAMUSCULAR | Status: DC | PRN

## 2015-06-24 MED ORDER — EPHEDRINE SULFATE 50 MG/ML IJ SOLN
INTRAMUSCULAR | Status: DC | PRN
  Administered 2015-06-24: 10 mg via INTRAVENOUS
  Administered 2015-06-24: 5 mg via INTRAVENOUS

## 2015-06-24 MED ORDER — MIDAZOLAM HCL 5 MG/5ML IJ SOLN
INTRAMUSCULAR | Status: DC | PRN
  Administered 2015-06-24: 2 mg via INTRAVENOUS

## 2015-06-24 MED ORDER — NEOSTIGMINE METHYLSULFATE 10 MG/10ML IV SOLN
INTRAVENOUS | Status: DC | PRN
  Administered 2015-06-24: 4 mg via INTRAVENOUS

## 2015-06-24 MED ORDER — PHENYLEPHRINE 40 MCG/ML (10ML) SYRINGE FOR IV PUSH (FOR BLOOD PRESSURE SUPPORT)
PREFILLED_SYRINGE | INTRAVENOUS | Status: AC
  Filled 2015-06-24: qty 10

## 2015-06-24 MED ORDER — PROPOFOL 10 MG/ML IV BOLUS
INTRAVENOUS | Status: DC | PRN
  Administered 2015-06-24: 10 mg via INTRAVENOUS
  Administered 2015-06-24: 30 mg via INTRAVENOUS
  Administered 2015-06-24: 200 mg via INTRAVENOUS
  Administered 2015-06-24: 10 mg via INTRAVENOUS
  Administered 2015-06-24: 50 mg via INTRAVENOUS

## 2015-06-24 MED ORDER — NEOSTIGMINE METHYLSULFATE 10 MG/10ML IV SOLN
INTRAVENOUS | Status: AC
  Filled 2015-06-24: qty 1

## 2015-06-24 MED ORDER — DEXAMETHASONE SODIUM PHOSPHATE 4 MG/ML IJ SOLN
INTRAMUSCULAR | Status: AC
  Filled 2015-06-24: qty 1

## 2015-06-24 MED ORDER — LACTATED RINGERS IV SOLN
INTRAVENOUS | Status: DC
  Administered 2015-06-24 (×2): via INTRAVENOUS

## 2015-06-24 SURGICAL SUPPLY — 45 items
APPLIER CLIP ROT 10 11.4 M/L (STAPLE) ×2
CANISTER SUCTION 2500CC (MISCELLANEOUS) ×2 IMPLANT
CHLORAPREP W/TINT 26ML (MISCELLANEOUS) ×2 IMPLANT
CLIP APPLIE ROT 10 11.4 M/L (STAPLE) ×1 IMPLANT
COVER MAYO STAND STRL (DRAPES) ×2 IMPLANT
COVER SURGICAL LIGHT HANDLE (MISCELLANEOUS) ×2 IMPLANT
DERMABOND ADVANCED (GAUZE/BANDAGES/DRESSINGS) ×1
DERMABOND ADVANCED .7 DNX12 (GAUZE/BANDAGES/DRESSINGS) ×1 IMPLANT
DRAPE C-ARM 42X72 X-RAY (DRAPES) ×2 IMPLANT
DRAPE WARM FLUID 44X44 (DRAPE) ×2 IMPLANT
ELECT REM PT RETURN 9FT ADLT (ELECTROSURGICAL) ×2
ELECTRODE REM PT RTRN 9FT ADLT (ELECTROSURGICAL) ×1 IMPLANT
FILTER SMOKE EVAC LAPAROSHD (FILTER) IMPLANT
GLOVE BIO SURGEON STRL SZ 6 (GLOVE) ×2 IMPLANT
GLOVE BIO SURGEON STRL SZ7.5 (GLOVE) ×2 IMPLANT
GLOVE BIOGEL PI IND STRL 6.5 (GLOVE) ×2 IMPLANT
GLOVE BIOGEL PI IND STRL 7.5 (GLOVE) ×2 IMPLANT
GLOVE BIOGEL PI INDICATOR 6.5 (GLOVE) ×2
GLOVE BIOGEL PI INDICATOR 7.5 (GLOVE) ×2
GOWN STRL REUS W/ TWL LRG LVL3 (GOWN DISPOSABLE) ×2 IMPLANT
GOWN STRL REUS W/TWL 2XL LVL3 (GOWN DISPOSABLE) ×2 IMPLANT
GOWN STRL REUS W/TWL LRG LVL3 (GOWN DISPOSABLE) ×2
HEMOSTAT SNOW SURGICEL 2X4 (HEMOSTASIS) ×2 IMPLANT
KIT BASIN OR (CUSTOM PROCEDURE TRAY) ×2 IMPLANT
KIT ROOM TURNOVER OR (KITS) ×2 IMPLANT
L-HOOK LAP DISP 36CM (ELECTROSURGICAL) ×2
LHOOK LAP DISP 36CM (ELECTROSURGICAL) ×1 IMPLANT
LIQUID BAND (GAUZE/BANDAGES/DRESSINGS) ×2 IMPLANT
NS IRRIG 1000ML POUR BTL (IV SOLUTION) ×2 IMPLANT
PAD ARMBOARD 7.5X6 YLW CONV (MISCELLANEOUS) ×2 IMPLANT
PENCIL BUTTON HOLSTER BLD 10FT (ELECTRODE) ×2 IMPLANT
POUCH SPECIMEN RETRIEVAL 10MM (ENDOMECHANICALS) ×2 IMPLANT
SCISSORS LAP 5X35 DISP (ENDOMECHANICALS) ×2 IMPLANT
SET CHOLANGIOGRAPH 5 50 .035 (SET/KITS/TRAYS/PACK) ×2 IMPLANT
SET IRRIG TUBING LAPAROSCOPIC (IRRIGATION / IRRIGATOR) ×2 IMPLANT
SLEEVE ENDOPATH XCEL 5M (ENDOMECHANICALS) ×2 IMPLANT
SPECIMEN JAR SMALL (MISCELLANEOUS) ×2 IMPLANT
SUT MNCRL AB 4-0 PS2 18 (SUTURE) ×2 IMPLANT
TOWEL OR 17X24 6PK STRL BLUE (TOWEL DISPOSABLE) ×2 IMPLANT
TOWEL OR 17X26 10 PK STRL BLUE (TOWEL DISPOSABLE) ×2 IMPLANT
TRAY LAPAROSCOPIC MC (CUSTOM PROCEDURE TRAY) ×2 IMPLANT
TROCAR XCEL BLUNT TIP 100MML (ENDOMECHANICALS) ×2 IMPLANT
TROCAR XCEL NON-BLD 11X100MML (ENDOMECHANICALS) ×2 IMPLANT
TROCAR XCEL NON-BLD 5MMX100MML (ENDOMECHANICALS) ×2 IMPLANT
TUBING INSUFFLATION (TUBING) ×2 IMPLANT

## 2015-06-24 NOTE — Anesthesia Postprocedure Evaluation (Signed)
Anesthesia Post Note  Patient: Christopher Jones  Procedure(s) Performed: Procedure(s) (LRB): LAPAROSCOPIC CHOLECYSTECTOMY  (N/A)  Patient location during evaluation: PACU Anesthesia Type: General Level of consciousness: awake and alert and patient cooperative Pain management: pain level controlled Vital Signs Assessment: post-procedure vital signs reviewed and stable Respiratory status: spontaneous breathing and respiratory function stable Cardiovascular status: stable Anesthetic complications: no    Last Vitals:  Filed Vitals:   06/24/15 1246 06/24/15 1248  BP: 147/94 150/94  Pulse: 108 102  Temp:    Resp: 18 10    Last Pain:  Filed Vitals:   06/24/15 1255  PainSc: [place]

## 2015-06-24 NOTE — H&P (Signed)
Christopher Jones  Location: [facility] Surgery Patient #: [id number] DOB: 1961-06-16 Married / Language: English / Race: White Male   History of Present Illness  The patient is a 54 year old male who presents with abdominal pain. Prior history Christopher Jones is a 54 year old male referred by Dr. Stephanie Acre for consultation regarding right upper quadrant abdominal pain, a right-sided liver lesion, and questionable gallbladder issues. The patient started having abdominal pain around 3 months ago. It was originally not very severe. It is episodic in nature. He had severe pain and August and went to urgent care where he had a CT scan. There was a lesion noted in his right liver and 2 tiny gallstones versus gallbladder sludge. Follow-up MRI was recommended in 6 months. However, he continued to have pain and discuss this with his primary care physician. The worst episode that he had was approximately 3 weeks ago. They have lasted for 5 hours and he was in so much pain he curled up in a fetal position on the bed. After this severe episode of pain, Dr. Cheron Schaumann went ahead and ordered the MRI. Based on the results, she referred him to myself and to [facility] gastroenterology for potential changes of cirrhosis and to evaluate the liver lesion and the gallbladder. The MRI and the ultrasound did not show any evidence of gallstones or gallbladder sludge. He is a heavy drinker. He used to drink around a half of a bottle of liquor every day. He has been cutting back and is down to around the third of a bottle of liquor per day. ]   The patient has completely stopped drinking alcohol. He underwent a HIDA scan and was demonstrated to have biliary dyskinesia. I was suspicious of gallbladder disease based on the description of his pain symptoms at his last visit. He has adjusted his diet as well, eliminating all fried foods and greasy foods. This has helped significantly, but he does still have right upper  quadrant pain. He denies fevers and chills.   Allergies  No Known Drug Allergies10/14/2016  Medication History Ampicillin (500MG  Capsule, Oral) Active. PROzac (40MG  Capsule, Oral daily) Active. Atenolol (50MG  Tablet, Oral) Active. Lexapro (20MG  Tablet, Oral) Active. OxyCODONE HCl (5MG  Tablet, Oral as needed) Active. Medications Reconciled    Review of Systems  All other systems negative  Vitals  Weight: 262 lb Height: 72in Body Surface Area: 2.39 m Body Mass Index: 35.53 kg/m  Temp.: 97.58F(Temporal)  Pulse: 80 (Regular)  BP: 130/76 (Sitting, Left Arm, Standard)       Physical Exam  General Mental Status-Alert. General Appearance-Consistent with stated age. Hydration-Well hydrated. Voice-Normal.  Head and Neck Head-normocephalic, atraumatic with no lesions or palpable masses.  Eye Sclera/Conjunctiva - Bilateral-No scleral icterus.  Chest and Lung Exam Chest and lung exam reveals -quiet, even and easy respiratory effort with no use of accessory muscles. Inspection Chest Wall - Normal. Back - normal.  Breast - Did not examine.  Cardiovascular Cardiovascular examination reveals -normal pedal pulses bilaterally. Note: regular rate and rhythm  Abdomen Inspection-Inspection Normal. Palpation/Percussion Palpation and Percussion of the abdomen reveal - Soft, Non Tender, No Rebound tenderness, No Rigidity (guarding) and No hepatosplenomegaly. Note: Soft, nondistended, mild tenderness in the right upper quadrant. Mild rectus diastases.   Peripheral Vascular Upper Extremity Inspection - Bilateral - Normal - No Clubbing, No Cyanosis, No Edema, Pulses Intact. Lower Extremity Palpation - Edema - Bilateral - No edema.  Neurologic Neurologic evaluation reveals -alert and oriented x 3 with no impairment of  recent or remote memory. Mental Status-Normal.  Musculoskeletal Global Assessment -Note: no gross  deformities.  Normal Exam - Left-Upper Extremity Strength Normal and Lower Extremity Strength Normal. Normal Exam - Right-Upper Extremity Strength Normal and Lower Extremity Strength Normal.  Lymphatic Head & Neck  General Head & Neck Lymphatics: Bilateral - Description - Normal. Axillary  General Axillary Region: Bilateral - Description - Normal. Tenderness - Non Tender.    Assessment & Plan CHRONIC CHOLECYSTITIS (K81.1) Impression: I'll plan laparoscopic cholecystectomy. The surgical procedure was described to the patient in detail. The patient was given educational material. I discussed the incision type and location, the location of the gallbladder, the anatomy of the bile ducts and arteries, and the typical progression of surgery. I discussed the possibility of converting to an open operation. I advised of the risks of bleeding, infection, damage to other structures (such as the bile duct, intestine or liver), bile leak, need for other procedures or surgeries, and post op diarrhea/constipation. We discussed the risk of blood clot. We discussed the recovery period and post operative restrictions. The patient was advised against taking blood thinners the week before surgery. Current Plans Pt Education - Laparoscopic Cholecystectomy: gallbladder You are being scheduled for surgery - Our schedulers will call you.  You should hear from our office's scheduling department within 5 working days about the location, date, and time of surgery. We try to make accommodations for patient's preferences in scheduling surgery, but sometimes the OR schedule or the surgeon's schedule prevents [place] from making those accommodations.  If you have not heard from our office [phone]) in 5 working days, call the office and ask for your surgeon's nurse.  If you have other questions about your diagnosis, plan, or surgery, call the office and ask for your surgeon's nurse.    Signed by Stark Klein, MD

## 2015-06-24 NOTE — Discharge Instructions (Signed)
[place] Office Phone Number [phone]   POST OP INSTRUCTIONS  Always review your discharge instruction sheet given to you by the facility where your surgery was performed.  IF YOU HAVE DISABILITY OR FAMILY LEAVE FORMS, YOU MUST BRING THEM TO THE OFFICE FOR PROCESSING.  DO NOT GIVE THEM TO YOUR DOCTOR.  1. A prescription for pain medication may be given to you upon discharge.  Take your pain medication as prescribed, if needed.  If narcotic pain medicine is not needed, then you may take acetaminophen (Tylenol) or ibuprofen (Advil) as needed. 2. Take your usually prescribed medications unless otherwise directed 3. If you need a refill on your pain medication, please contact your pharmacy.  They will contact our office to request authorization.  Prescriptions will not be filled after 5pm or on week-ends. 4. You should eat very light the first 24 hours after surgery, such as soup, crackers, pudding, etc.  Resume your normal diet the day after surgery 5. It is common to experience some constipation if taking pain medication after surgery.  Increasing fluid intake and taking a stool softener will usually help or prevent this problem from occurring.  A mild laxative (Milk of Magnesia or Miralax) should be taken according to package directions if there are no bowel movements after 48 hours. 6. You may shower in 48 hours.  The surgical glue will flake off in 2-3 weeks.   7. ACTIVITIES:  No strenuous activity or heavy lifting for 1 week.   a. You may drive when you no longer are taking prescription pain medication, you can comfortably wear a seatbelt, and you can safely maneuver your car and apply brakes. b. RETURN TO WORK:  __________1-3 weeks depending on restrictions_______________ You should see your doctor in the office for a follow-up appointment approximately three-four weeks after your surgery.    WHEN TO CALL YOUR DOCTOR: 1. Fever over 101.0 2. Nausea and/or  vomiting. 3. Extreme swelling or bruising. 4. Continued bleeding from incision. 5. Increased pain, redness, or drainage from the incision.  The clinic staff is available to answer your questions during regular business hours.  Please dont hesitate to call and ask to speak to one of the nurses for clinical concerns.  If you have a medical emergency, go to the nearest emergency room or call 911.  A surgeon from [facility] is always on call at the hospital.  For further questions, please visit [email or web address]

## 2015-06-24 NOTE — Transfer of Care (Signed)
Immediate Anesthesia Transfer of Care Note  Patient: Christopher Jones  Procedure(s) Performed: Procedure(s): LAPAROSCOPIC CHOLECYSTECTOMY  (N/A)  Patient Location: PACU  Anesthesia Type:General  Level of Consciousness: awake, alert  and oriented  Airway & Oxygen Therapy: Patient Spontanous Breathing and Patient connected to nasal cannula oxygen  Post-op Assessment: Report given to RN and Post -op Vital signs reviewed and stable  Post vital signs: Reviewed and stable  Last Vitals:  Filed Vitals:   06/24/15 0849 06/24/15 0850  BP:  162/102  Pulse: 75   Temp: 36.5 C   Resp: 20     Complications: No apparent anesthesia complications

## 2015-06-24 NOTE — Anesthesia Procedure Notes (Signed)
Procedure Name: Intubation Date/Time: 06/24/2015 11:27 AM Performed by: Merdis Delay Pre-anesthesia Checklist: Patient identified, Timeout performed, Emergency Drugs available, Suction available and Patient being monitored Patient Re-evaluated:Patient Re-evaluated prior to inductionOxygen Delivery Method: Circle system utilized Preoxygenation: Pre-oxygenation with 100% oxygen Intubation Type: IV induction Ventilation: Mask ventilation without difficulty and Oral airway inserted - appropriate to patient size Laryngoscope Size: Mac and 4 Grade View: Grade I Tube type: Oral Tube size: 7.5 mm Number of attempts: 1 Airway Equipment and Method: Stylet Placement Confirmation: ETT inserted through vocal cords under direct vision,  positive ETCO2,  CO2 detector and breath sounds checked- equal and bilateral Secured at: 22 cm Tube secured with: Tape Dental Injury: Teeth and Oropharynx as per pre-operative assessment

## 2015-06-24 NOTE — Anesthesia Preprocedure Evaluation (Addendum)
Anesthesia Evaluation  Patient identified by MRN, date of birth, ID band Patient awake    Reviewed: Allergy & Precautions, NPO status , Patient's Chart, lab work & pertinent test results  Airway Mallampati: III  TM Distance: >3 FB Neck ROM: full    Dental  (+) Teeth Intact, Dental Advisory Given   Pulmonary neg pulmonary ROS,    breath sounds clear to auscultation       Cardiovascular hypertension,  Rhythm:regular Rate:Normal     Neuro/Psych Anxiety    GI/Hepatic GERD  ,  Endo/Other  obese  Renal/GU      Musculoskeletal   Abdominal   Peds  Hematology   Anesthesia Other Findings   Reproductive/Obstetrics                            Anesthesia Physical Anesthesia Plan  ASA: II  Anesthesia Plan: General   Post-op Pain Management:    Induction: Intravenous  Airway Management Planned: Oral ETT  Additional Equipment:   Intra-op Plan:   Post-operative Plan: Extubation in OR  Informed Consent: I have reviewed the patients History and Physical, chart, labs and discussed the procedure including the risks, benefits and alternatives for the proposed anesthesia with the patient or authorized representative who has indicated his/her understanding and acceptance.     Plan Discussed with: CRNA, Anesthesiologist and Surgeon  Anesthesia Plan Comments:         Anesthesia Quick Evaluation

## 2015-06-24 NOTE — Op Note (Signed)
Laparoscopic Cholecystectomy   Indications: This patient presents with chronic cholecystitis and will undergo laparoscopic cholecystectomy.  Pre-operative Diagnosis: Chronic cholecystitis   Post-operative Diagnosis: Same and cirrhosis  Surgeon: Stark Klein   Assistants: Sharyn Dross, MD  Anesthesia: General endotracheal anesthesia and local  ASA Class: 2  Procedure Details  The patient was seen again in the Holding Room. The risks, benefits, complications, treatment options, and expected outcomes were discussed with the patient. The possibilities of  bleeding, recurrent infection, damage to nearby structures, the need for additional procedures, failure to diagnose a condition, the possible need to convert to an open procedure, and creating a complication requiring transfusion or operation were discussed with the patient. The likelihood of improving the patient's symptoms with return to their baseline status is good.    The patient and/or family concurred with the proposed plan, giving informed consent. The site of surgery properly noted. The patient was taken to Operating Room, and the procedure verified as Laparoscopic Cholecystectomy with Intraoperative Cholangiogram. A Time Out was held and the above information confirmed.  Prior to the induction of general anesthesia, antibiotic prophylaxis was administered. General endotracheal anesthesia was then administered and tolerated well. After the induction, the abdomen was prepped with Chloraprep and draped in the sterile fashion. The patient was positioned in the supine position.  Local anesthetic agent was injected into the skin near the umbilicus and an incision made. We dissected down to the abdominal fascia with blunt dissection.  The fascia was incised vertically and we entered the peritoneal cavity bluntly.  A pursestring suture of 0-Vicryl was placed around the fascial opening.  The Hasson cannula was inserted and secured with the stay  suture.  Pneumoperitoneum was then created with CO2 and tolerated well without any adverse changes in the patient's vital signs. An 11-mm port was placed in the subxiphoid position.  Two 5-mm ports were placed in the right upper quadrant. All skin incisions were infiltrated with a local anesthetic agent before making the incision and placing the trocars.   We positioned the patient in reverse Trendelenburg, tilted slightly to the patient's left.  The gallbladder was identified, the fundus grasped and retracted cephalad. Adhesions were lysed bluntly and with the electrocautery where indicated, taking care not to injure any adjacent organs or viscus. The infundibulum was grasped and retracted laterally, exposing the peritoneum overlying the triangle of Calot. This was then divided and exposed in a blunt fashion. A critical view of the cystic duct and cystic artery was obtained.  The cystic duct was clearly identified and bluntly dissected circumferentially. The cystic duct was ligated with a clip distally.    The cystic duct was then ligated with clips and divided. The cystic artery was identified, dissected free, ligated with clips and divided as well. A second branch was identified higher up on the gallbladder and was clipped.  There was some bleeding, and the firm, friable liver was seen to be bleeding near the falciform.  This was coagulated.    The gallbladder was dissected partially from the liver bed in retrograde fashion with the electrocautery. The gallbladder was extremely contracted and intrahepatic.  A portion of the gallbladder was left in place adherent to the liver in order to avoid tearing the capsule and causing bleeding in this firm, cirrhotic liver.  The remnant of gallbladder was removed and placed in an Endocatch bag.  The gallbladder and Endocatch bag were then removed through the umbilical port site.  The liver bed was irrigated and inspected.  Hemostasis was achieved with the  electrocautery. Copious irrigation was utilized and was repeatedly aspirated until clear.    We again inspected the right upper quadrant for hemostasis.  Pneumoperitoneum was released as we removed the trocars.   The pursestring suture was used to close the umbilical fascia.  4-0 Monocryl was used to close the skin.   The skin was cleaned and dry, and Dermabond was applied. The patient was then extubated and brought to the recovery room in stable condition. Instrument, sponge, and needle counts were correct at closure and at the conclusion of the case.   Findings: Chronic inflammation.  Cirrhosis, at least moderate.  Very intrahepatic gallbladder.  The cystic duct was clearly seen and triply clipped.  Estimated Blood Loss: min         Drains: n/a          Specimens: Gallbladder to pathology       Complications: None; patient tolerated the procedure well.         Disposition: PACU - hemodynamically stable.         Condition: stable

## 2015-06-24 NOTE — Interval H&P Note (Signed)
History and Physical Interval Note:  06/24/2015 9:29 AM  Christopher Jones  has presented today for surgery, with the diagnosis of CHRONIC CHOLECYSTITIS  The various methods of treatment have been discussed with the patient and family. After consideration of risks, benefits and other options for treatment, the patient has consented to  Procedure(s): LAPAROSCOPIC CHOLECYSTECTOMY WITH INTRAOPERATIVE CHOLANGIOGRAM (N/A) as a surgical intervention .  The patient's history has been reviewed, patient examined, no change in status, stable for surgery.  I have reviewed the patient's chart and labs.  Questions were answered to the patient's satisfaction.     Trystin Hargrove

## 2015-06-25 ENCOUNTER — Encounter (HOSPITAL_COMMUNITY): Payer: Self-pay | Admitting: General Surgery

## 2016-01-03 ENCOUNTER — Other Ambulatory Visit: Payer: Self-pay | Admitting: Gastroenterology

## 2016-01-03 DIAGNOSIS — K7030 Alcoholic cirrhosis of liver without ascites: Secondary | ICD-10-CM

## 2016-01-03 DIAGNOSIS — K769 Liver disease, unspecified: Secondary | ICD-10-CM

## 2016-03-04 ENCOUNTER — Ambulatory Visit
Admission: RE | Admit: 2016-03-04 | Discharge: 2016-03-04 | Disposition: A | Discharge status: Home / Self Care | Payer: 59 | Source: Ambulatory Visit | Attending: Gastroenterology | Admitting: Gastroenterology

## 2016-03-04 DIAGNOSIS — K769 Liver disease, unspecified: Secondary | ICD-10-CM

## 2016-03-04 DIAGNOSIS — K7030 Alcoholic cirrhosis of liver without ascites: Secondary | ICD-10-CM

## 2016-03-04 MED ORDER — GADOXETATE DISODIUM 0.25 MMOL/ML IV SOLN
10.0000 mL | Freq: Once | INTRAVENOUS | Status: AC | PRN
  Administered 2016-03-04: 10 mL via INTRAVENOUS

## 2016-04-13 ENCOUNTER — Encounter (HOSPITAL_COMMUNITY): Payer: Self-pay

## 2016-04-13 ENCOUNTER — Emergency Department (HOSPITAL_COMMUNITY): Payer: 59

## 2016-04-13 ENCOUNTER — Emergency Department (HOSPITAL_COMMUNITY)
Admission: EM | Admit: 2016-04-13 | Discharge: 2016-04-14 | Disposition: A | Discharge status: Home / Self Care | Payer: 59 | Attending: Emergency Medicine | Admitting: Emergency Medicine

## 2016-04-13 DIAGNOSIS — Z79899 Other long term (current) drug therapy: Secondary | ICD-10-CM | POA: Insufficient documentation

## 2016-04-13 DIAGNOSIS — R109 Unspecified abdominal pain: Secondary | ICD-10-CM | POA: Diagnosis present

## 2016-04-13 DIAGNOSIS — K56600 Partial intestinal obstruction, unspecified as to cause: Secondary | ICD-10-CM

## 2016-04-13 DIAGNOSIS — I10 Essential (primary) hypertension: Secondary | ICD-10-CM | POA: Diagnosis not present

## 2016-04-13 DIAGNOSIS — Z792 Long term (current) use of antibiotics: Secondary | ICD-10-CM | POA: Diagnosis not present

## 2016-04-13 LAB — COMPREHENSIVE METABOLIC PANEL
ALT: 31 U/L (ref 17–63)
AST: 29 U/L (ref 15–41)
Albumin: 5 g/dL (ref 3.5–5.0)
Alkaline Phosphatase: 61 U/L (ref 38–126)
Anion gap: 9 (ref 5–15)
BUN: 10 mg/dL (ref 6–20)
CO2: 31 mmol/L (ref 22–32)
Calcium: 10.1 mg/dL (ref 8.9–10.3)
Chloride: 100 mmol/L — ABNORMAL LOW (ref 101–111)
Creatinine, Ser: 0.77 mg/dL (ref 0.61–1.24)
GFR calc Af Amer: >60 mL/min (ref >60)
GFR calc non Af Amer: >60 mL/min (ref >60)
Glucose, Bld: 106 mg/dL — ABNORMAL HIGH (ref 65–99)
Potassium: 3.8 mmol/L (ref 3.5–5.1)
Sodium: 140 mmol/L (ref 135–145)
Total Bilirubin: 1.2 mg/dL (ref 0.3–1.2)
Total Protein: 8.1 g/dL (ref 6.5–8.1)

## 2016-04-13 LAB — CBC
HEMATOCRIT: 46.7 % (ref 39.0–52.0)
HEMOGLOBIN: 17 g/dL (ref 13.0–17.0)
MCH: 34.8 pg — AB (ref 26.0–34.0)
MCHC: 36.4 g/dL — AB (ref 30.0–36.0)
MCV: 95.7 fL (ref 78.0–100.0)
Platelets: 129 10*3/uL — ABNORMAL LOW (ref 150–400)
RBC: 4.88 MIL/uL (ref 4.22–5.81)
RDW: 12.7 % (ref 11.5–15.5)
WBC: 5.7 10*3/uL (ref 4.0–10.5)

## 2016-04-13 LAB — URINALYSIS, ROUTINE W REFLEX MICROSCOPIC
BILIRUBIN URINE: NEGATIVE
Glucose, UA: NEGATIVE mg/dL
HGB URINE DIPSTICK: NEGATIVE
Ketones, ur: NEGATIVE mg/dL
Leukocytes, UA: NEGATIVE
Nitrite: NEGATIVE
PH: 6 (ref 5.0–8.0)
Protein, ur: NEGATIVE mg/dL
SPECIFIC GRAVITY, URINE: 1.008 (ref 1.005–1.030)

## 2016-04-13 LAB — LIPASE, BLOOD: Lipase: 42 U/L (ref 11–51)

## 2016-04-13 MED ORDER — IOPAMIDOL (ISOVUE-300) INJECTION 61%
100.0000 mL | Freq: Once | INTRAVENOUS | Status: AC | PRN
  Administered 2016-04-13: 100 mL via INTRAVENOUS

## 2016-04-13 MED ORDER — SODIUM CHLORIDE 0.9 % IV BOLUS (SEPSIS)
1000.0000 mL | Freq: Once | INTRAVENOUS | Status: AC
  Administered 2016-04-13: 1000 mL via INTRAVENOUS

## 2016-04-13 MED ORDER — SENNOSIDES-DOCUSATE SODIUM 8.6-50 MG PO TABS: 2.0000 | ORAL_TABLET | Freq: Every day | ORAL | 0 refills | Status: DC

## 2016-04-13 MED ORDER — POLYETHYLENE GLYCOL 3350 17 G PO PACK: 17.0000 g | PACK | Freq: Every day | ORAL | 0 refills | Status: DC

## 2016-04-13 MED ORDER — HYDROCODONE-ACETAMINOPHEN 10-325 MG PO TABS: 1.0000 | ORAL_TABLET | Freq: Four times a day (QID) | ORAL | 0 refills | Status: DC | PRN

## 2016-04-13 MED ORDER — ONDANSETRON 4 MG PO TBDP
4.0000 mg | ORAL_TABLET | Freq: Once | ORAL | Status: AC | PRN
  Administered 2016-04-13: 4 mg via ORAL
  Filled 2016-04-13: qty 1

## 2016-04-13 NOTE — Discharge Instructions (Signed)
Please follow up with Dr. Barry Dienes and Dr. Michail Sermon. We believe your intermittent stomach pain should resolve on its own. We have provided a prescription for pain control and constipation.

## 2016-04-13 NOTE — ED Notes (Signed)
In CT now. 

## 2016-04-13 NOTE — ED Triage Notes (Signed)
PT C/O MID ABDOMINAL PAIN WITH NAUSEA X3 WEEKS. DENIES FEVER, VOMITING, OR DIARRHEA.

## 2016-04-13 NOTE — ED Provider Notes (Signed)
[facility] DEPT Provider Note   CSN: [id number] Arrival date & time: 04/13/16  1742   History   Chief Complaint Chief Complaint  Christopher Jones presents with  . Abdominal Pain    HPI Christopher Jones is a 55 y.o. male.  Christopher Jones is a 55yo man with PMH cholecystectomy, inguinal hernias, GERD, alcohol abuse, HTN, HLD who presents for acute intermittent episodes of severe abdominal pain and nausea that have become increasingly more frequent over the past 3 weeks. The pain is located above the umbilicus and sharp and sudden in onset and resolution. Episodes last anywhere from 20 minutes to 3 hours and he cannot do anything but curl up and lay down until it subsides. He reports feeling a "knot" in the area of pain that seems to resolve as the pain does. He reports subjective feeling of gas and obstruction during the episodes. These episodes are now occurring on a daily basis and he concerned about managing them during upcoming international travel. He denies fevers/chill, vomiting, constipation, diarrhea, chest pain, or similar past experience.   The history is provided by the Christopher Jones.    Past Medical History:  Diagnosis Date  . Anxiety   . GERD (gastroesophageal reflux disease)   . H/O ETOH abuse   . Hyperlipidemia   . Hypertension   . Christopher Jones is Jehovah's Witness    REFUSES BLOOD PRODUCTS EVEN IN THE EVENT THAT BLOOD WOULD SAVE HIS LIFE  . Vertigo     Christopher Jones Active Problem List   Diagnosis Date Noted  . Christopher Jones is Jehovah's Witness   . Anal itching 05/16/2012    Past Surgical History:  Procedure Laterality Date  . CHOLECYSTECTOMY N/A 06/24/2015   Procedure: LAPAROSCOPIC CHOLECYSTECTOMY ;  Surgeon: Stark Klein, MD;  Location: [place] [facility];  Service: General;  Laterality: N/A;  . SHOULDER ARTHROSCOPY Left 13  . VASECTOMY         Home Medications    Prior to Admission medications   Medication Sig Start Date End Date Taking? Authorizing Provider  ALPRAZolam Duanne Moron) 1 MG  tablet Take 1 mg by mouth 3 (three) times daily as needed. For anxiety 07/17/13  Yes Historical Provider, MD  ampicillin (PRINCIPEN) 500 MG capsule Take 500 mg by mouth 2 (two) times daily.   Yes Historical Provider, MD  atenolol-chlorthalidone (TENORETIC) 100-25 MG tablet Take 1 tablet by mouth at bedtime.  05/24/15  Yes Historical Provider, MD  B Complex-C (B-COMPLEX WITH VITAMIN C) tablet Take 1 tablet by mouth daily.   Yes Historical Provider, MD  escitalopram (LEXAPRO) 20 MG tablet Take 20 mg by mouth daily. 01/16/15  Yes Historical Provider, MD  Lysine 500 MG CAPS Take 1,000 mg by mouth daily.    Yes Historical Provider, MD  Multiple Vitamins-Minerals (MULTIVITAMIN WITH MINERALS) tablet Take 1 tablet by mouth daily.   Yes Historical Provider, MD  Omega-3 Fatty Acids (FISH OIL) 1200 MG CAPS Take 2 capsules by mouth daily.   Yes Historical Provider, MD  omeprazole (PRILOSEC) 40 MG capsule Take 40 mg by mouth daily. 05/15/13  Yes Historical Provider, MD  ondansetron (ZOFRAN ODT) 4 MG disintegrating tablet 4mg  ODT q4 hours prn nausea/vomit 04/03/15  Yes Milton Ferguson, MD  tadalafil (CIALIS) 20 MG tablet Take 20 mg by mouth daily as needed for erectile dysfunction.    Yes Historical Provider, MD  traZODone (DESYREL) 100 MG tablet Take 100 mg by mouth at bedtime. 01/07/15  Yes Historical Provider, MD  HYDROcodone-acetaminophen (NORCO) 10-325 MG tablet Take 1  tablet by mouth every 6 (six) hours as needed. 04/13/16   Asencion Partridge, MD  oxyCODONE (ROXICODONE) 5 MG immediate release tablet Take 1-2 tablets (5-10 mg total) by mouth every 6 (six) hours as needed for severe pain. Christopher Jones not taking: Reported on 04/13/2016 06/24/15   Stark Klein, MD  polyethylene glycol (MIRALAX / GLYCOLAX) packet Take 17 g by mouth daily. 04/13/16   Asencion Partridge, MD  senna-docusate (SENOKOT-S) 8.6-50 MG tablet Take 2 tablets by mouth at bedtime. 04/13/16   Asencion Partridge, MD    Family History Family History  Problem Relation  Age of Onset  . Hypertension Mother   . Diabetes Father   . Arthritis Father   . Heart disease Father     Social History Social History  Substance Use Topics  . Smoking status: Never Smoker  . Smokeless tobacco: Never Used  . Alcohol use Yes     Comment: fifth of vodka per week     Allergies   Shellfish allergy   Review of Systems Review of Systems  Constitutional: Negative for appetite change, chills and fever.  Respiratory: Negative for cough and shortness of breath.   Cardiovascular: Negative for chest pain and palpitations.  Gastrointestinal: Positive for abdominal pain and nausea. Negative for constipation, diarrhea and vomiting.  Genitourinary: Negative for difficulty urinating and dysuria.  All other systems reviewed and are negative.    Physical Exam Updated Vital Signs BP (!) 152/104 (BP Location: Left Arm)   Pulse 80   Temp 97.8 F (36.6 C) (Oral)   Resp 20   Ht 6' (1.829 m)   Wt 102.1 kg   SpO2 96%   BMI 30.52 kg/m   Physical Exam  Constitutional: He is oriented to person, place, and time. He appears well-developed and well-nourished. No distress.  HENT:  Head: Normocephalic.  Mouth/Throat: Oropharynx is clear and moist.  Eyes: EOM are normal. Pupils are equal, round, and reactive to light.  Neck: Normal range of motion. Neck supple.  Cardiovascular: Normal rate, regular rhythm, normal heart sounds and intact distal pulses.  Exam reveals no gallop and no friction rub.   No murmur heard. Pulmonary/Chest: Effort normal and breath sounds normal. No respiratory distress. He has no wheezes. He has no rales.  Abdominal: Soft. Bowel sounds are normal. He exhibits no distension and no mass. There is no tenderness. There is no rebound and no guarding. No hernia.  Musculoskeletal: Normal range of motion. He exhibits no edema, tenderness or deformity.  Neurological: He is alert and oriented to person, place, and time.  Skin: Skin is warm and dry. Capillary  refill takes less than 2 seconds. He is not diaphoretic. No erythema. No pallor.  Psychiatric: He has a normal mood and affect. His behavior is normal. Thought content normal.  Nursing note and vitals reviewed.   ED Treatments / Results  Labs (all labs ordered are listed, but only abnormal results are displayed) Labs Reviewed  COMPREHENSIVE METABOLIC PANEL - Abnormal; Notable for the following:       Result Value   Chloride 100 (*)    Glucose, Bld 106 (*)    All other components within normal limits  CBC - Abnormal; Notable for the following:    MCH 34.8 (*)    MCHC 36.4 (*)    Platelets 129 (*)    All other components within normal limits  LIPASE, BLOOD  URINALYSIS, ROUTINE W REFLEX MICROSCOPIC (NOT AT [facility])    EKG  EKG Interpretation None  Radiology Ct Abdomen Pelvis W Contrast  Result Date: 04/13/2016 CLINICAL DATA:  Mid abdominal pain and nausea for 3 weeks. EXAM: CT ABDOMEN AND PELVIS WITH CONTRAST TECHNIQUE: Multidetector CT imaging of the abdomen and pelvis was performed using the standard protocol following bolus administration of intravenous contrast. CONTRAST:  165mL ISOVUE-300 IOPAMIDOL (ISOVUE-300) INJECTION 61% COMPARISON:  CT from 02/12/2015, MRI 03/04/2016 FINDINGS: Lower chest: No acute findings Hepatobiliary: Nodular appearance of the liver surface consistent cirrhosis left hepatic enlargement relative to right lobe. Stable 7 mm hypervascular lesion in the posterior dome of the right hepatic lobe, series 3, image 109. No other additional liver masses. Cholecystectomy with mild periportal edema/congestion. Pancreas: Unremarkable. No pancreatic ductal dilatation or surrounding inflammatory changes. Spleen: Normal in size without focal abnormality. Adrenals/Urinary Tract: Adrenal glands are unremarkable. Kidneys are normal, without renal calculi, focal lesion, or hydronephrosis. Bladder is unremarkable. Stomach/Bowel: Normal bowel rotation. Nondistended stomach.  Mild thickening of the gastric antrum appears to be due to underdistention, findings are similar to 2016. Scattered diverticulosis without acute diverticulitis. There is more large bowel interposed between the liver and anterior abdominal wall since prior. Moderate volume of fecal residue throughout large bowel. No bowel obstruction or acute inflammation. Vascular/Lymphatic: No pathologically enlarged lymph nodes. Recannulized periumbilical vein. No abdominal aortic aneurysm. Reproductive: Small right-sided hydrocele. Peripheral zone calcification in the mildly enlarged prostate. Other: No ascites Musculoskeletal: Degenerative disc disease L5-S1. No acute osseous abnormality. IMPRESSION: Cirrhotic appearing liver with stable 7 mm right hepatic dome hyper enhancing nodule unchanged in appearance. Mild periportal edema. Recannulized paraumbilical vein. Findings suggest component of portal hypertension. There is anterior interposition of colon between the liver and abdominal wall extending into the right diaphragm. 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 Cholecystectomy. Electronically Signed   By: Ashley Royalty M.D.   On: 04/13/2016 21:52    Procedures Procedures (including critical care time)  Medications Ordered in ED Medications  ondansetron (ZOFRAN-ODT) disintegrating tablet 4 mg (4 mg Oral Given 04/13/16 1934)  sodium chloride 0.9 % bolus 1,000 mL (0 mLs Intravenous Stopped 04/14/16 0016)  iopamidol (ISOVUE-300) 61 % injection 100 mL (100 mLs Intravenous Contrast Given 04/13/16 2107)     Initial Impression / Assessment and Plan / ED Course  I have reviewed the triage vital signs and the nursing notes.  Pertinent labs & imaging results that were available during my care of the Christopher Jones were reviewed by me and considered in my medical decision making (see chart for details).  Clinical Course   Christopher Jones presenting with intermittent episodes of severe periumbilical abdominal pain,  clinical history concerning for partial SBO vs internal hernia, benign exam and normal labs. Abdominal CT revealed cirrhotic liver, portal hypertension, and anterior interposition of colon between the liver and abdominal wall extending into the right diaphragm, potential source of pain (Chilaiditi syndrome). Moderate amount of bowel gas also present. Discussed with surgery who felt this should resolve spontaneously and it had developed. Christopher Jones plan to follow up with Dr. Dellie Burns and Dr. Janan Ridge and also provided prescription for [facility] for symptom relief and laxatives to potentially assist with relief of extensive bowel gas.   Final Clinical Impressions(s) / ED Diagnoses   Final diagnoses:  Partial intestinal obstruction, unspecified cause    New Prescriptions Discharge Medication List as of 04/13/2016 11:28 PM    START taking these medications   Details  HYDROcodone-acetaminophen (NORCO) 10-325 MG tablet Take 1 tablet by mouth every 6 (six) hours as needed., Starting Tue 04/13/2016, Print  polyethylene glycol (MIRALAX / GLYCOLAX) packet Take 17 g by mouth daily., Starting Tue 04/13/2016, Normal    senna-docusate (SENOKOT-S) 8.6-50 MG tablet Take 2 tablets by mouth at bedtime., Starting Tue 04/13/2016, Print         Asencion Partridge, MD 04/14/16 Yoakum Yao, MD 04/14/16 [phone]

## 2016-05-26 ENCOUNTER — Ambulatory Visit: Payer: Self-pay | Admitting: General Surgery

## 2016-05-26 NOTE — H&P (Signed)
History of Present Illness Ralene Ok MD; 05/26/2016 3:28 PM) The patient is a 55 year old male who presents with an abdominal wall hernia. Patient is a 55 year old male Who was previously seen by Dr. Barry Dienes for an evaluation of an incisional hernia. Patient underwent laparoscopic cholecystectomy approximately year ago. He states that her last several months he noticed a bulge just superior to the umbilicus. He states he is able to push back in. This causes him a significant amount of pain. Patient with CT scan which revealed a cirrhotic -appearing liver. No hernias seen. There did appear to be some inflammation just superotemporally umbilicus this is consistent with the area pain on exam.  Patient states he is a Jehovah's Witness and does not accept blood.   Allergies Davy Pique Bynum, CMA; 05/26/2016 3:09 PM) No Known Drug Allergies10/14/2016 Shellfish  Medication History Davy Pique Bynum, CMA; 05/26/2016 3:09 PM) Norco (5-325MG  Tablet, 1-2 Tablet Oral every four hours, as needed, Taken starting 05/04/2016) Active. Ampicillin (500MG  Capsule, Oral) Active. PROzac (40MG  Capsule, Oral daily) Active. Atenolol (50MG  Tablet, Oral) Active. Lexapro (20MG  Tablet, Oral) Active. Medications Reconciled  Vitals (Sonya Bynum CMA; 05/26/2016 3:09 PM) 05/26/2016 3:08 PM Weight: 220 lb Height: 72in Body Surface Area: 2.22 m Body Mass Index: 29.84 kg/m  Pulse: 76 (Regular)  BP: 128/80 (Sitting, Left Arm, Standard)       Physical Exam Ralene Ok MD; 05/26/2016 3:29 PM) General Mental Status-Alert. General Appearance-Consistent with stated age. Hydration-Well hydrated. Voice-Normal.  Head and Neck Head-normocephalic, atraumatic with no lesions or palpable masses. Trachea-midline. Thyroid Gland Characteristics - normal size and consistency.  Chest and Lung Exam Chest and lung exam reveals -quiet, even and easy respiratory effort with no use of  accessory muscles and on auscultation, normal breath sounds, no adventitious sounds and normal vocal resonance. Inspection Chest Wall - Normal. Back - normal.  Cardiovascular Cardiovascular examination reveals -normal heart sounds, regular rate and rhythm with no murmurs and normal pedal pulses bilaterally.  Abdomen Inspection Skin - Scar - no surgical scars. Hernias - Ventral - Reducible(Just above the umbilicus.). Palpation/Percussion Normal exam - Soft, Non Tender, No Rebound tenderness, No Rigidity (guarding) and No hepatosplenomegaly. Auscultation Normal exam - Bowel sounds normal.    Assessment & Plan Ralene Ok MD; 05/26/2016 3:29 PM) VENTRAL HERNIA WITHOUT OBSTRUCTION OR GANGRENE (K43.9) Impression: 55 year old male with likel supraumbilical ventral hernia. 1. The patient will like to proceed to the operating room for laparoscopic ventral hernia repair with mesh.  2. I discussed with the patient the signs and symptoms of incarceration and strangulation and the need to proceed to the ER should they occur.  3. I discussed with the patient the risks and benefits of the procedure to include but not limited to: Infection, bleeding, damage to surrounding structures, possible need for further surgery, possible nerve pain, and possible recurrence. The patient was understanding and wishes to proceed.

## 2016-06-28 DIAGNOSIS — E119 Type 2 diabetes mellitus without complications: Secondary | ICD-10-CM | POA: Diagnosis not present

## 2016-07-29 DIAGNOSIS — E119 Type 2 diabetes mellitus without complications: Secondary | ICD-10-CM | POA: Diagnosis not present

## 2016-08-06 DIAGNOSIS — J069 Acute upper respiratory infection, unspecified: Secondary | ICD-10-CM | POA: Diagnosis not present

## 2016-08-17 DIAGNOSIS — D485 Neoplasm of uncertain behavior of skin: Secondary | ICD-10-CM | POA: Diagnosis not present

## 2016-08-17 DIAGNOSIS — D225 Melanocytic nevi of trunk: Secondary | ICD-10-CM | POA: Diagnosis not present

## 2016-08-17 DIAGNOSIS — D2262 Melanocytic nevi of left upper limb, including shoulder: Secondary | ICD-10-CM | POA: Diagnosis not present

## 2016-08-17 DIAGNOSIS — D2271 Melanocytic nevi of right lower limb, including hip: Secondary | ICD-10-CM | POA: Diagnosis not present

## 2016-08-26 DIAGNOSIS — E119 Type 2 diabetes mellitus without complications: Secondary | ICD-10-CM | POA: Diagnosis not present

## 2016-09-15 DIAGNOSIS — R1010 Upper abdominal pain, unspecified: Secondary | ICD-10-CM | POA: Diagnosis not present

## 2016-09-15 DIAGNOSIS — Z800 Family history of malignant neoplasm of digestive organs: Secondary | ICD-10-CM | POA: Diagnosis not present

## 2016-09-15 DIAGNOSIS — D126 Benign neoplasm of colon, unspecified: Secondary | ICD-10-CM | POA: Diagnosis not present

## 2016-09-15 DIAGNOSIS — Z86010 Personal history of colonic polyps: Secondary | ICD-10-CM | POA: Diagnosis not present

## 2016-09-26 DIAGNOSIS — E119 Type 2 diabetes mellitus without complications: Secondary | ICD-10-CM | POA: Diagnosis not present

## 2016-09-26 IMAGING — CR DG CHEST 1V PORT
1 series · 1 of 1 positions shown · non-contrast
Comparison: September 11, 2008

CLINICAL DATA: Chest pain

EXAM:
PORTABLE CHEST 1 VIEW

[AP]
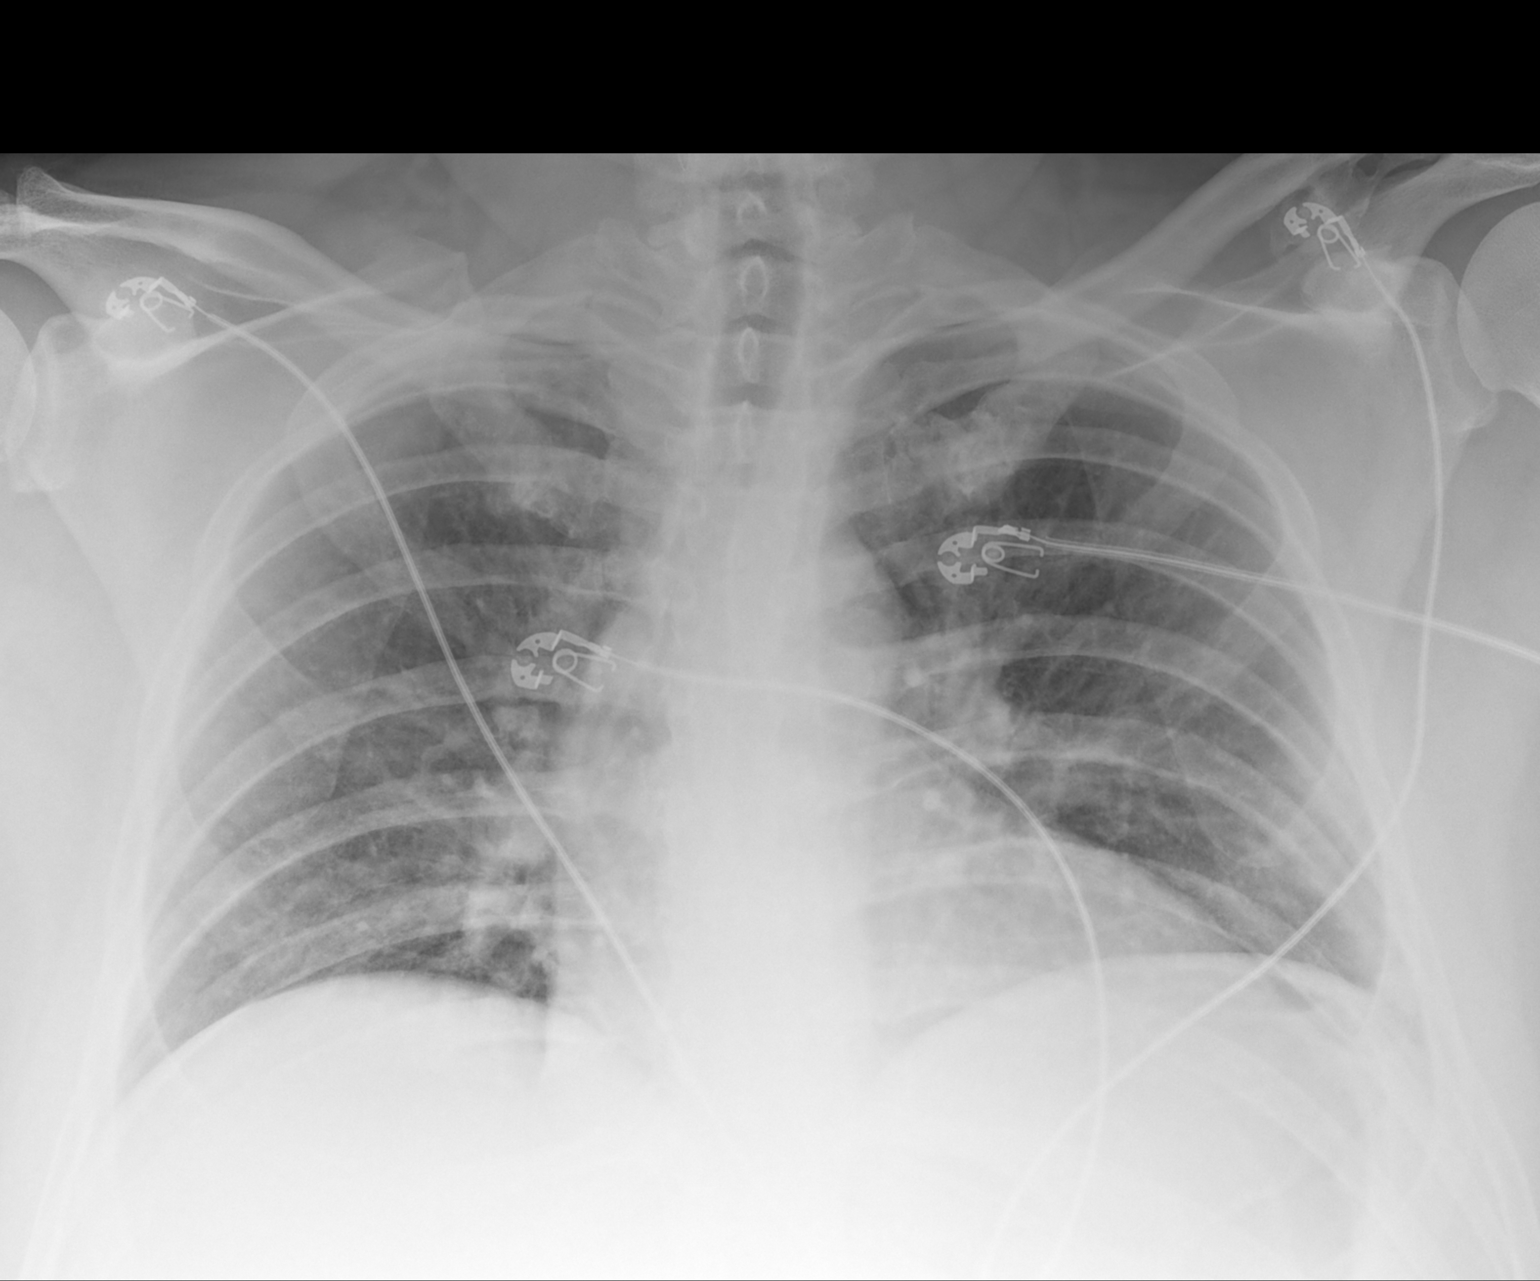

[1 of 1 positions shown; findings below may reference images not displayed]

FINDINGS: There is no edema or consolidation. The heart size and pulmonary
vascularity normal. No adenopathy. No bone lesions. No pneumothorax.
IMPRESSION: No edema or consolidation.

## 2016-10-26 DIAGNOSIS — E119 Type 2 diabetes mellitus without complications: Secondary | ICD-10-CM | POA: Diagnosis not present

## 2016-11-01 DIAGNOSIS — Z125 Encounter for screening for malignant neoplasm of prostate: Secondary | ICD-10-CM | POA: Diagnosis not present

## 2016-11-01 DIAGNOSIS — E119 Type 2 diabetes mellitus without complications: Secondary | ICD-10-CM | POA: Diagnosis not present

## 2016-11-01 DIAGNOSIS — E782 Mixed hyperlipidemia: Secondary | ICD-10-CM | POA: Diagnosis not present

## 2016-11-01 DIAGNOSIS — R7309 Other abnormal glucose: Secondary | ICD-10-CM | POA: Diagnosis not present

## 2016-11-01 DIAGNOSIS — Z79899 Other long term (current) drug therapy: Secondary | ICD-10-CM | POA: Diagnosis not present

## 2016-11-02 DIAGNOSIS — Z0000 Encounter for general adult medical examination without abnormal findings: Secondary | ICD-10-CM | POA: Diagnosis not present

## 2016-11-26 DIAGNOSIS — E119 Type 2 diabetes mellitus without complications: Secondary | ICD-10-CM | POA: Diagnosis not present

## 2016-12-26 DIAGNOSIS — E119 Type 2 diabetes mellitus without complications: Secondary | ICD-10-CM | POA: Diagnosis not present

## 2017-01-26 DIAGNOSIS — E119 Type 2 diabetes mellitus without complications: Secondary | ICD-10-CM | POA: Diagnosis not present

## 2017-02-14 DIAGNOSIS — M7710 Lateral epicondylitis, unspecified elbow: Secondary | ICD-10-CM | POA: Diagnosis not present

## 2017-02-14 DIAGNOSIS — G5760 Lesion of plantar nerve, unspecified lower limb: Secondary | ICD-10-CM | POA: Diagnosis not present

## 2017-02-14 DIAGNOSIS — Z23 Encounter for immunization: Secondary | ICD-10-CM | POA: Diagnosis not present

## 2017-02-26 DIAGNOSIS — E119 Type 2 diabetes mellitus without complications: Secondary | ICD-10-CM | POA: Diagnosis not present

## 2017-05-10 DIAGNOSIS — K219 Gastro-esophageal reflux disease without esophagitis: Secondary | ICD-10-CM | POA: Diagnosis not present

## 2017-05-10 DIAGNOSIS — I10 Essential (primary) hypertension: Secondary | ICD-10-CM | POA: Diagnosis not present

## 2017-08-24 DIAGNOSIS — Z86018 Personal history of other benign neoplasm: Secondary | ICD-10-CM | POA: Diagnosis not present

## 2017-08-24 DIAGNOSIS — D1801 Hemangioma of skin and subcutaneous tissue: Secondary | ICD-10-CM | POA: Diagnosis not present

## 2017-08-24 DIAGNOSIS — L814 Other melanin hyperpigmentation: Secondary | ICD-10-CM | POA: Diagnosis not present

## 2017-10-07 IMAGING — CT CT ABD-PELV W/ CM
2 of 4 series · 16 of 46 positions shown, 18 images · IV contrast (ISOVUE)
Comparison: CT from 02/12/2015, MRI 03/04/2016

CLINICAL DATA: Mid abdominal pain and nausea for 3 weeks.

EXAM:
CT ABDOMEN AND PELVIS WITH CONTRAST
TECHNIQUE: Multidetector CT imaging of the abdomen and pelvis was performed
using the standard protocol following bolus administration of
intravenous contrast.
CONTRAST:  100mL 2YXCZP-KVV IOPAMIDOL (2YXCZP-KVV) INJECTION 61%

[Series 2: abd/pel with · axial · 0.98mm/px · z∈[+944,+1399]mm · 13 of 101 slices shown, 15 images]
[im 5/101  soft-tissue]
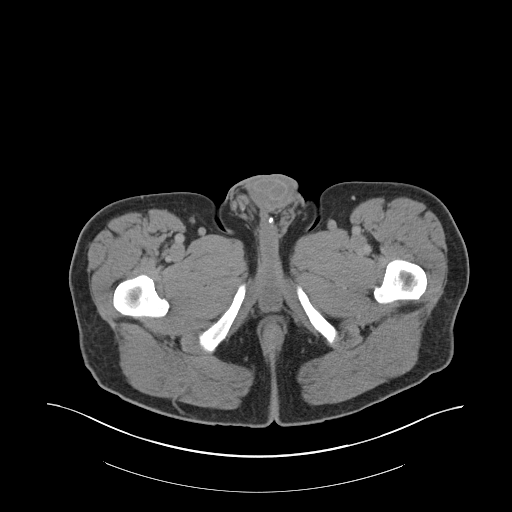
[im 5/101  bone]
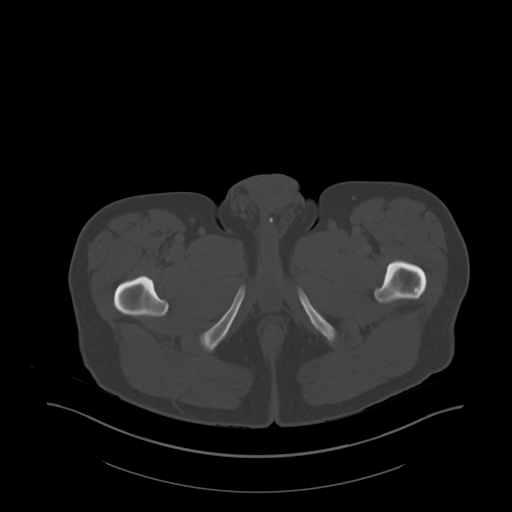
[im 14/101  soft-tissue]
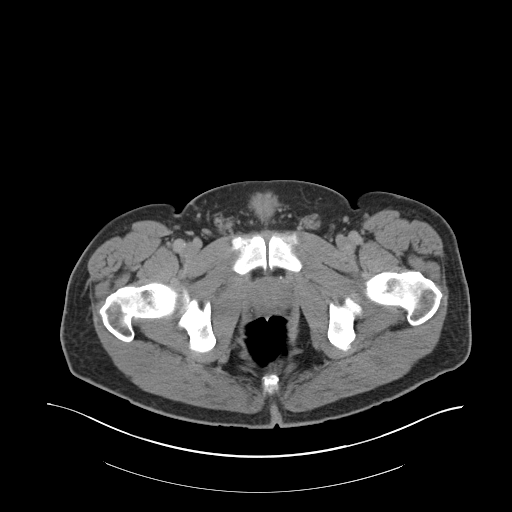
[im 23/101  soft-tissue]
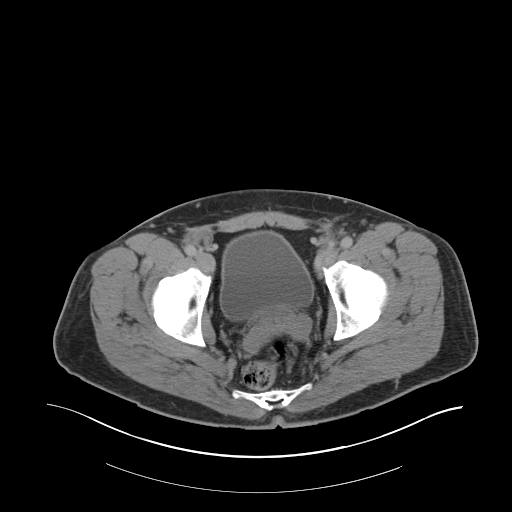
[im 28/101  soft-tissue]
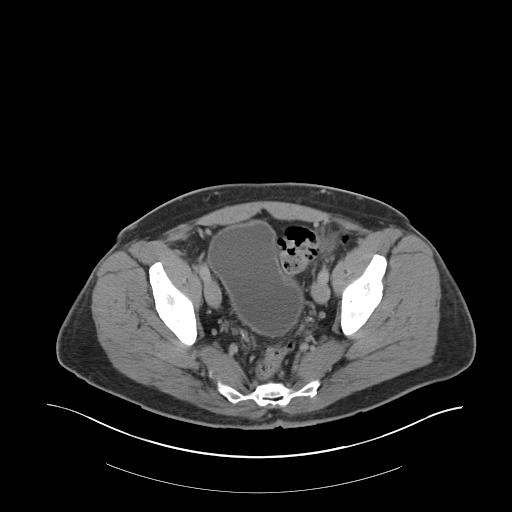
[im 37/101  soft-tissue]
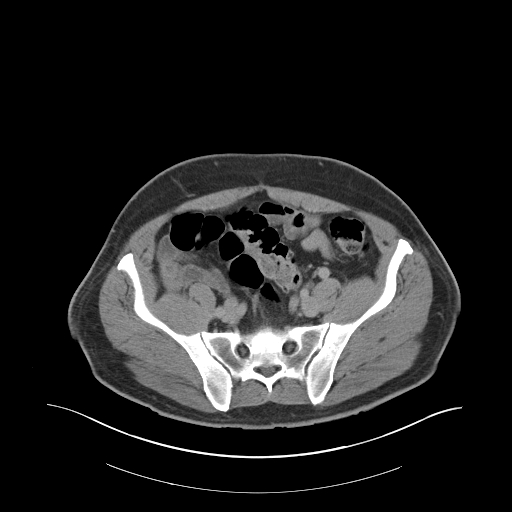
[im 41/101  soft-tissue]
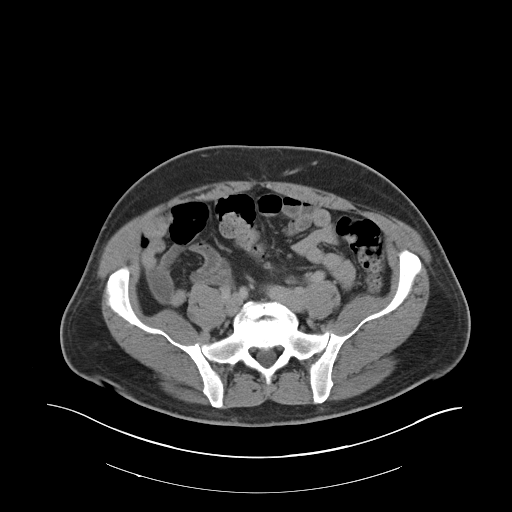
[im 51/101  soft-tissue]
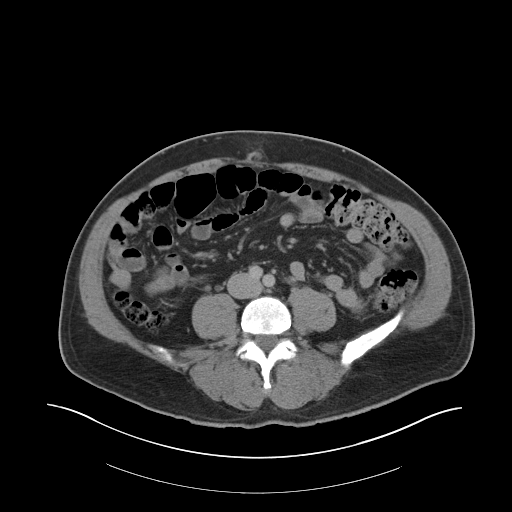
[im 60/101  soft-tissue]
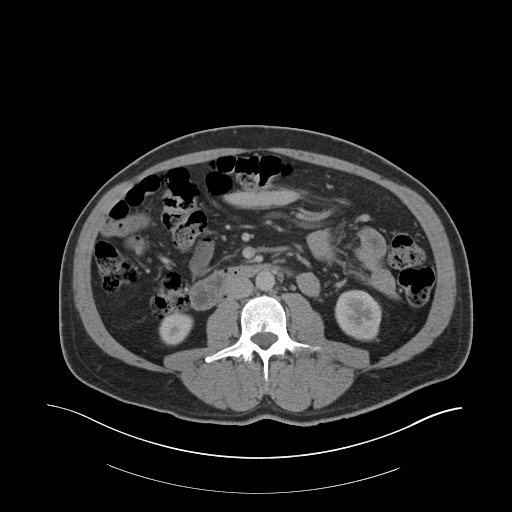
[im 64/101  soft-tissue]
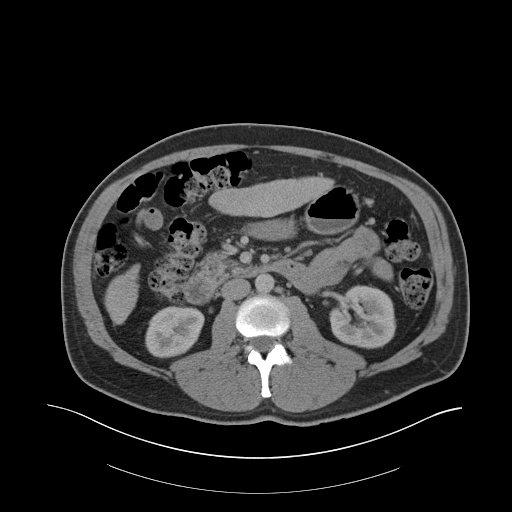
[im 64/101  bone]
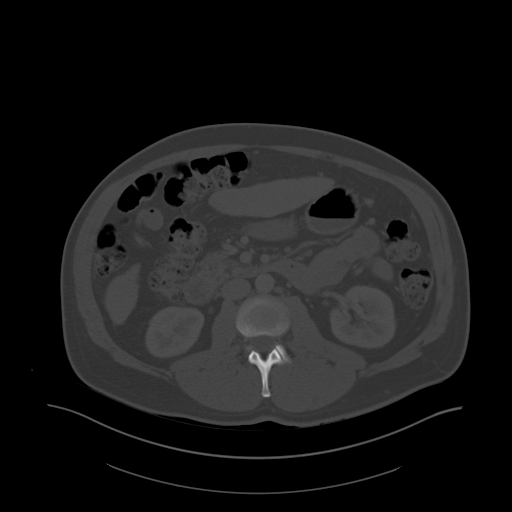
[im 73/101  soft-tissue]
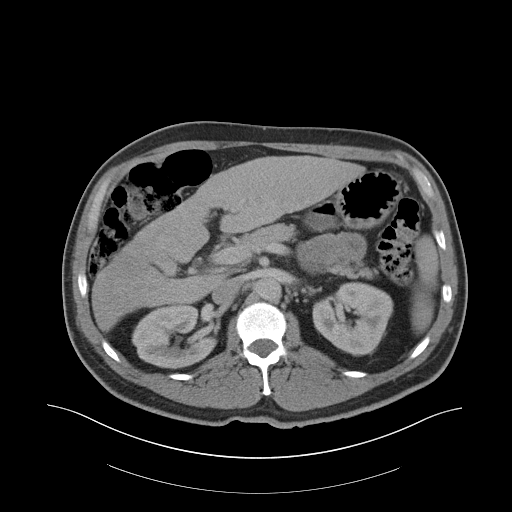
[im 78/101  soft-tissue]
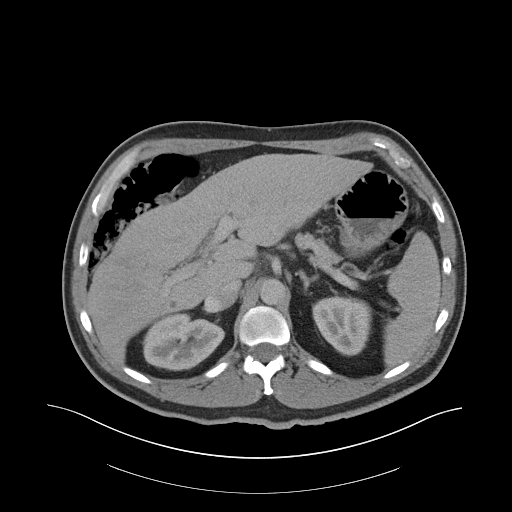
[im 87/101  soft-tissue]
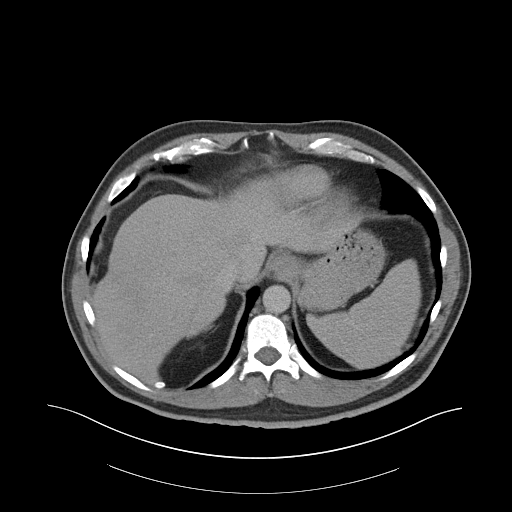
[im 96/101  soft-tissue]
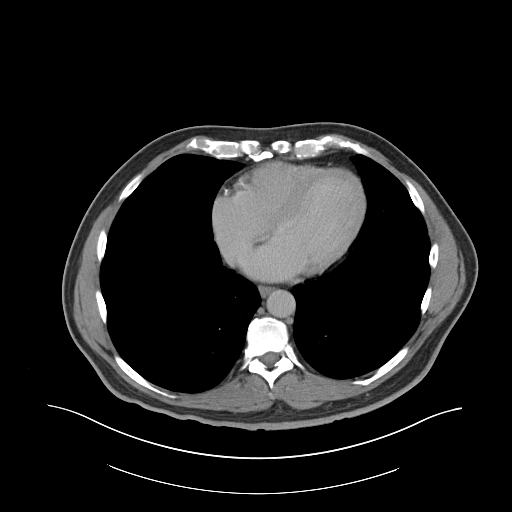

[Series 3: coronal a/|p · coronal · 0.88mm/px · 3 of 163 slices shown]
[im 55/163  soft-tissue]
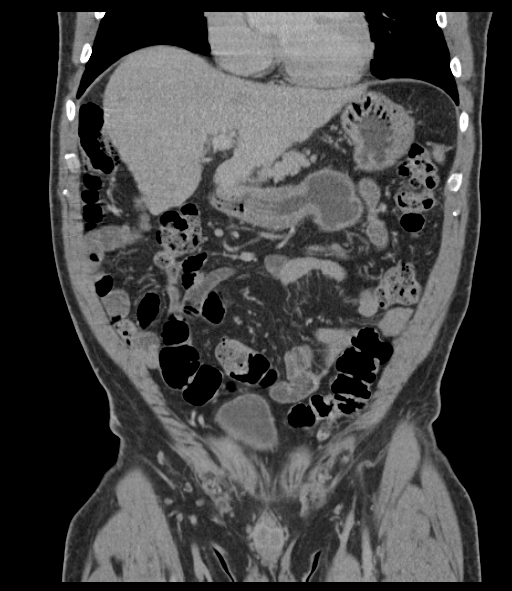
[im 73/163  soft-tissue]
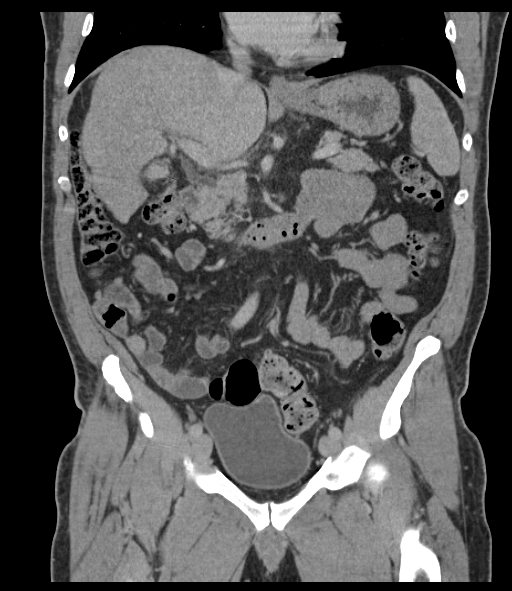
[im 91/163  soft-tissue]
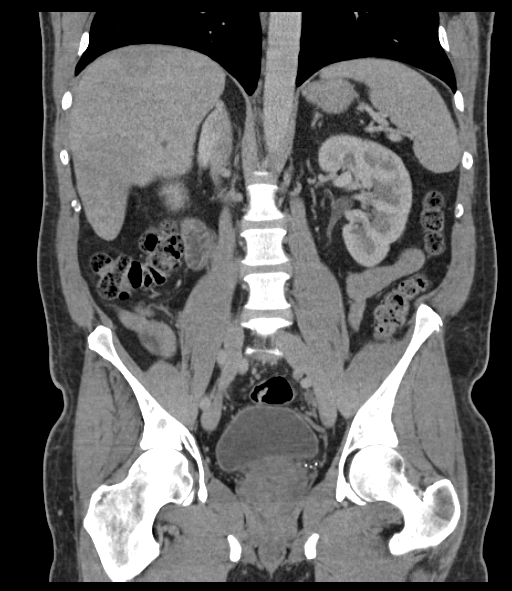

[16 of 46 positions shown; findings below may reference images not displayed]

FINDINGS: Lower chest: No acute findings

Hepatobiliary: Nodular appearance of the liver surface consistent
cirrhosis left hepatic enlargement relative to right lobe. Stable 7
mm hypervascular lesion in the posterior dome of the right hepatic
lobe, series 3, image 109. No other additional liver masses.
Cholecystectomy with mild periportal edema/congestion.

Pancreas: Unremarkable. No pancreatic ductal dilatation or
surrounding inflammatory changes.

Spleen: Normal in size without focal abnormality.

Adrenals/Urinary Tract: Adrenal glands are unremarkable. Kidneys are
normal, without renal calculi, focal lesion, or hydronephrosis.
Bladder is unremarkable.

Stomach/Bowel: Normal bowel rotation. Nondistended stomach. Mild
thickening of the gastric antrum appears to be due to
underdistention, findings are similar to 8695. Scattered
diverticulosis without acute diverticulitis. There is more large
bowel interposed between the liver and anterior abdominal wall since
prior. Moderate volume of fecal residue throughout large bowel. No
bowel obstruction or acute inflammation.

Vascular/Lymphatic: No pathologically enlarged lymph nodes.
Recannulized periumbilical vein. No abdominal aortic aneurysm.

Reproductive: Small right-sided hydrocele. Peripheral zone
calcification in the mildly enlarged prostate.

Other: No ascites

Musculoskeletal: Degenerative disc disease L5-S1. No acute osseous
abnormality.
IMPRESSION: Cirrhotic appearing liver with stable 7 mm right hepatic dome hyper
enhancing nodule unchanged in appearance. Mild periportal edema.
Recannulized paraumbilical vein. Findings suggest component of
portal hypertension.

There is anterior interposition of colon between the liver and
abdominal wall extending into the right diaphragm. This may
contribute to pain (Chilaiditi syndrome).

Cholecystectomy.

## 2017-10-24 DIAGNOSIS — J209 Acute bronchitis, unspecified: Secondary | ICD-10-CM | POA: Diagnosis not present

## 2017-11-11 DIAGNOSIS — E782 Mixed hyperlipidemia: Secondary | ICD-10-CM | POA: Diagnosis not present

## 2017-11-11 DIAGNOSIS — Z0000 Encounter for general adult medical examination without abnormal findings: Secondary | ICD-10-CM | POA: Diagnosis not present

## 2017-11-11 DIAGNOSIS — Z125 Encounter for screening for malignant neoplasm of prostate: Secondary | ICD-10-CM | POA: Diagnosis not present

## 2017-11-11 DIAGNOSIS — Z79899 Other long term (current) drug therapy: Secondary | ICD-10-CM | POA: Diagnosis not present

## 2017-11-11 DIAGNOSIS — E119 Type 2 diabetes mellitus without complications: Secondary | ICD-10-CM | POA: Diagnosis not present

## 2018-01-09 DIAGNOSIS — R972 Elevated prostate specific antigen [PSA]: Secondary | ICD-10-CM | POA: Diagnosis not present

## 2018-01-09 DIAGNOSIS — Z79899 Other long term (current) drug therapy: Secondary | ICD-10-CM | POA: Diagnosis not present

## 2018-01-26 DIAGNOSIS — R05 Cough: Secondary | ICD-10-CM | POA: Diagnosis not present

## 2018-01-26 DIAGNOSIS — R509 Fever, unspecified: Secondary | ICD-10-CM | POA: Diagnosis not present

## 2018-03-10 DIAGNOSIS — Z23 Encounter for immunization: Secondary | ICD-10-CM | POA: Diagnosis not present

## 2018-03-22 DIAGNOSIS — Z8042 Family history of malignant neoplasm of prostate: Secondary | ICD-10-CM | POA: Diagnosis not present

## 2018-03-22 DIAGNOSIS — N401 Enlarged prostate with lower urinary tract symptoms: Secondary | ICD-10-CM | POA: Diagnosis not present

## 2018-03-22 DIAGNOSIS — R351 Nocturia: Secondary | ICD-10-CM | POA: Diagnosis not present

## 2018-04-03 ENCOUNTER — Telehealth: Payer: Self-pay | Admitting: Licensed Clinical Social Worker

## 2018-04-03 ENCOUNTER — Encounter: Payer: Self-pay | Admitting: Licensed Clinical Social Worker

## 2018-04-03 NOTE — Telephone Encounter (Signed)
New genetic counseling referral received from Dr. Junious Silk for strong family history of prostate cancer. Pt has been scheduled to see Faith Rogue on 10/31 at [facility] mailed to the pt and faxed to the referring for review.

## 2018-04-05 DIAGNOSIS — L739 Follicular disorder, unspecified: Secondary | ICD-10-CM | POA: Diagnosis not present

## 2018-04-05 DIAGNOSIS — L700 Acne vulgaris: Secondary | ICD-10-CM | POA: Diagnosis not present

## 2018-04-10 ENCOUNTER — Encounter: Payer: Self-pay | Admitting: Licensed Clinical Social Worker

## 2018-04-10 ENCOUNTER — Telehealth: Payer: Self-pay | Admitting: Licensed Clinical Social Worker

## 2018-04-10 NOTE — Telephone Encounter (Signed)
I received a call from Bethena Roys, Dr. Lyndal Rainbow office to reschedule genetic counseling appt. Pt will be out of the country until the end of November. Pt has been rescheduled to see Faith Rogue on 12/5 at [place] a voicemail with the new appt date and time. Letter mailed.

## 2018-04-18 ENCOUNTER — Telehealth: Payer: Self-pay | Admitting: Genetic Counselor

## 2018-04-18 NOTE — Telephone Encounter (Signed)
Pt cld to reschedule appt to see Roma Kayser on 12/4 at 9am.

## 2018-04-27 ENCOUNTER — Other Ambulatory Visit: Payer: 59

## 2018-04-27 ENCOUNTER — Encounter: Payer: 59 | Admitting: Licensed Clinical Social Worker

## 2018-05-31 ENCOUNTER — Other Ambulatory Visit: Payer: 59

## 2018-05-31 ENCOUNTER — Encounter: Payer: 59 | Admitting: Genetic Counselor

## 2018-06-01 ENCOUNTER — Encounter: Payer: 59 | Admitting: Licensed Clinical Social Worker

## 2018-06-01 ENCOUNTER — Other Ambulatory Visit: Payer: 59

## 2018-07-20 DIAGNOSIS — N401 Enlarged prostate with lower urinary tract symptoms: Secondary | ICD-10-CM | POA: Diagnosis not present

## 2018-07-20 DIAGNOSIS — R3912 Poor urinary stream: Secondary | ICD-10-CM | POA: Diagnosis not present

## 2018-09-01 DIAGNOSIS — E782 Mixed hyperlipidemia: Secondary | ICD-10-CM | POA: Diagnosis not present

## 2018-09-01 DIAGNOSIS — I10 Essential (primary) hypertension: Secondary | ICD-10-CM | POA: Diagnosis not present

## 2018-09-01 DIAGNOSIS — K219 Gastro-esophageal reflux disease without esophagitis: Secondary | ICD-10-CM | POA: Diagnosis not present

## 2021-04-23 ENCOUNTER — Other Ambulatory Visit (HOSPITAL_BASED_OUTPATIENT_CLINIC_OR_DEPARTMENT_OTHER): Payer: Self-pay | Admitting: Family Medicine

## 2021-04-23 DIAGNOSIS — E782 Mixed hyperlipidemia: Secondary | ICD-10-CM

## 2021-04-23 DIAGNOSIS — I10 Essential (primary) hypertension: Secondary | ICD-10-CM

## 2021-04-23 DIAGNOSIS — E119 Type 2 diabetes mellitus without complications: Secondary | ICD-10-CM

## 2021-04-27 ENCOUNTER — Other Ambulatory Visit: Payer: Self-pay

## 2021-04-27 ENCOUNTER — Ambulatory Visit (HOSPITAL_BASED_OUTPATIENT_CLINIC_OR_DEPARTMENT_OTHER)
Admission: RE | Admit: 2021-04-27 | Discharge: 2021-04-27 | Disposition: A | Discharge status: Home / Self Care | Payer: Self-pay | Source: Ambulatory Visit | Attending: Family Medicine | Admitting: Family Medicine

## 2021-04-27 DIAGNOSIS — E119 Type 2 diabetes mellitus without complications: Secondary | ICD-10-CM | POA: Insufficient documentation

## 2021-04-27 DIAGNOSIS — I10 Essential (primary) hypertension: Secondary | ICD-10-CM | POA: Insufficient documentation

## 2021-04-27 DIAGNOSIS — E782 Mixed hyperlipidemia: Secondary | ICD-10-CM | POA: Insufficient documentation

## 2021-05-01 ENCOUNTER — Other Ambulatory Visit: Payer: Self-pay

## 2021-05-01 ENCOUNTER — Encounter: Payer: Self-pay | Admitting: Cardiology

## 2021-05-01 ENCOUNTER — Ambulatory Visit: Payer: 59 | Admitting: Cardiology

## 2021-05-01 VITALS — BP 131/87 | HR 68 | Temp 98.3°F | Resp 16 | Ht 72.0 in | Wt 238.6 lb

## 2021-05-01 DIAGNOSIS — Z8249 Family history of ischemic heart disease and other diseases of the circulatory system: Secondary | ICD-10-CM

## 2021-05-01 DIAGNOSIS — I25118 Atherosclerotic heart disease of native coronary artery with other forms of angina pectoris: Secondary | ICD-10-CM

## 2021-05-01 DIAGNOSIS — I7121 Aneurysm of the ascending aorta, without rupture: Secondary | ICD-10-CM

## 2021-05-01 DIAGNOSIS — R931 Abnormal findings on diagnostic imaging of heart and coronary circulation: Secondary | ICD-10-CM

## 2021-05-01 DIAGNOSIS — E782 Mixed hyperlipidemia: Secondary | ICD-10-CM

## 2021-05-01 MED ORDER — ATORVASTATIN CALCIUM 80 MG PO TABS: 80.0000 mg | ORAL_TABLET | Freq: Every day | ORAL | 2 refills | Status: DC

## 2021-05-01 MED ORDER — LOSARTAN POTASSIUM 50 MG PO TABS: 50.0000 mg | ORAL_TABLET | Freq: Every evening | ORAL | 2 refills | Status: DC

## 2021-05-01 NOTE — Progress Notes (Signed)
Primary Physician/Referring:  Jonathon Jordan, MD  Patient ID: Christopher Jones, male    DOB: 03/05/1961, 60 y.o.   MRN: [id number]  Chief Complaint  Patient presents with   Coronary Artery Disease   Aorta aneurysm    New Patient (Initial Visit)   HPI:    Christopher Jones  is a 60 y.o. Caucasian male patient referred to me on a urgent basis for evaluation of abnormal coronary calcium score and small sized ascending aortic aneurysm at 4.2 cm, test performed on 04/28/2021.  He does run from about 1 mile occasionally.  Initially denied any symptoms but on further questioning, states that occasionally he has very mild chest discomfort while he is active.  Patient has a very strong family history of premature coronary artery disease with multiple members in the family especially on the father side having had coronary events at 69 years of age, his father had coronary artery disease in his 20s and one of his brothers died at the age of 33 and had stents placed.  Patient is presently completely asymptomatic but is extremely concerned and is accompanied by his wife.  He is a recovering alcoholic, quit drinking alcohol about 6 years ago.  Past Medical History:  Diagnosis Date   Anxiety    GERD (gastroesophageal reflux disease)    H/O ETOH abuse    Hyperlipidemia    Hypertension    Patient is Jehovah's Witness    REFUSES BLOOD PRODUCTS EVEN IN THE EVENT THAT BLOOD WOULD SAVE HIS LIFE   Vertigo    Past Surgical History:  Procedure Laterality Date   CHOLECYSTECTOMY N/A 06/24/2015   Procedure: LAPAROSCOPIC CHOLECYSTECTOMY ;  Surgeon: Stark Klein, MD;  Location: [facility];  Service: General;  Laterality: N/A;   SHOULDER ARTHROSCOPY Left 27   VASECTOMY     Family History  Problem Relation Age of Onset   Hypertension Mother    Diabetes Father    Arthritis Father    Heart disease Father 51   CAD Brother 11    Social History   Tobacco Use   Smoking status: Never   Smokeless tobacco: Never   Substance Use Topics   Alcohol use: Not Currently    Comment: Alcoholic and now abstinant since 2016   Marital Status: Married  ROS  Review of Systems  Cardiovascular:  Positive for chest pain. Negative for dyspnea on exertion and leg swelling.  Gastrointestinal:  Negative for melena.  Objective  Blood pressure 131/87, pulse 68, temperature 98.3 F (36.8 C), temperature source Temporal, resp. rate 16, height 6' (1.829 m), weight 238 lb 9.6 oz (108.2 kg), SpO2 97 %. Body mass index is 32.36 kg/m.  Vitals with BMI 05/01/2021 04/14/2016 04/13/2016  Height _0  - -  Weight 238 lbs 10 oz - -  BMI 84.53 - -  Systolic 646 803 212  Diastolic 87 248 82  Pulse 68 80 61    Physical Exam Neck:     Vascular: No carotid bruit or JVD.  Cardiovascular:     Rate and Rhythm: Normal rate and regular rhythm.     Pulses: Intact distal pulses.     Heart sounds: Normal heart sounds. No murmur heard.   No gallop.  Pulmonary:     Effort: Pulmonary effort is normal.     Breath sounds: Normal breath sounds.  Abdominal:     General: Bowel sounds are normal.     Palpations: Abdomen is soft.  Musculoskeletal:  General: No swelling.     Laboratory examination:   No results for input(s): NA, K, CL, CO2, GLUCOSE, BUN, CREATININE, CALCIUM, GFRNONAA, GFRAA in the last 8760 hours. CrCl cannot be calculated (Patient's most recent lab result is older than the maximum 21 days allowed.).  CMP Latest Ref Rng & Units 04/13/2016 06/16/2015 04/03/2015  Glucose 65 - 99 mg/dL 106(H) 143(H) 147(H)  BUN 6 - 20 mg/dL 10 <5(L) 6  Creatinine 0.61 - 1.24 mg/dL 0.77 0.56(L) 0.62  Sodium 135 - 145 mmol/L 140 142 136  Potassium 3.5 - 5.1 mmol/L 3.8 3.4(L) 3.5  Chloride 101 - 111 mmol/L 100(L) 103 98(L)  CO2 22 - 32 mmol/L _0 Calcium 8.9 - 10.3 mg/dL 10.1 9.7 9.2  Total Protein 6.5 - 8.1 g/dL 8.1 7.1 7.6  Total Bilirubin 0.3 - 1.2 mg/dL 1.2 1.1 1.0  Alkaline Phos 38 - 126 U/L 61 139(H) 157(H)  AST 15 -  41 U/L 29 206(H) 194(H)  ALT 17 - 63 U/L 31 96(H) 157(H)   CBC Latest Ref Rng & Units 04/13/2016 06/16/2015 04/03/2015  WBC 4.0 - 10.5 K/uL 5.7 5.2 10.8(H)  Hemoglobin 13.0 - 17.0 g/dL 17.0 16.1 15.9  Hematocrit 39.0 - 52.0 % 46.7 47.5 45.2  Platelets 150 - 400 K/uL 129(L) 125(L) 180   Lipid Panel No results for input(s): CHOL, TRIG, LDLCALC, VLDL, HDL, CHOLHDL, LDLDIRECT in the last 8760 hours. Lipid Panel  No results found for: CHOL, TRIG, HDL, CHOLHDL, VLDL, LDLCALC, LDLDIRECT, LABVLDL   HEMOGLOBIN A1C No results found for: HGBA1C, MPG TSH No results for input(s): TSH in the last 8760 hours.  External labs:   Cholesterol, total 204.000 m 04/20/2021 HDL 40.000 mg 04/20/2021 LDL 119.000 m 04/20/2021 Triglycerides 264.000 m 04/20/2021  A1C 5.200 % 04/20/2021 TSH 0.440 04/20/2021  Hemoglobin 16.200 g/d 04/20/2021  Creatinine, Serum 0.800 mg/ 04/20/2021 Potassium 3.800 mm 04/20/2021 Magnesium 1.900 mg/ 07/17/2020 ALT (SGPT) 22.000 U/L 04/20/2021 Medications and allergies   Allergies  Allergen Reactions   Shellfish Allergy Anaphylaxis     Medication prior to this encounter:   Outpatient Medications Prior to Visit  Medication Sig Dispense Refill   ALPRAZolam (XANAX) 1 MG tablet Take 1 mg by mouth 3 (three) times daily as needed. For anxiety     ampicillin (PRINCIPEN) 500 MG capsule Take 500 mg by mouth 2 (two) times daily.     aspirin EC 81 MG tablet Take 81 mg by mouth daily. Swallow whole.     atenolol-chlorthalidone (TENORETIC) 100-25 MG tablet Take 1 tablet by mouth at bedtime.      B Complex-C (B-COMPLEX WITH VITAMIN C) tablet Take 1 tablet by mouth daily.     escitalopram (LEXAPRO) 20 MG tablet Take 20 mg by mouth daily.     Lysine 500 MG CAPS Take 1,000 mg by mouth daily.      Multiple Vitamins-Minerals (MULTIVITAMIN WITH MINERALS) tablet Take 1 tablet by mouth daily.     omeprazole (PRILOSEC) 40 MG capsule Take 40 mg by mouth daily.     tadalafil (CIALIS) 20  MG tablet Take 20 mg by mouth daily as needed for erectile dysfunction.      traZODone (DESYREL) 100 MG tablet Take 100 mg by mouth at bedtime.     HYDROcodone-acetaminophen (NORCO) 10-325 MG tablet Take 1 tablet by mouth every 6 (six) hours as needed. 20 tablet 0   Omega-3 Fatty Acids (FISH OIL) 1200 MG CAPS Take 2 capsules by mouth daily.  ondansetron (ZOFRAN ODT) 4 MG disintegrating tablet 89m ODT q4 hours prn nausea/vomit 15 tablet 0   oxyCODONE (ROXICODONE) 5 MG immediate release tablet Take 1-2 tablets (5-10 mg total) by mouth every 6 (six) hours as needed for severe pain. (Patient not taking: Reported on 04/13/2016) 40 tablet 0   polyethylene glycol (MIRALAX / GLYCOLAX) packet Take 17 g by mouth daily. 14 each 0   senna-docusate (SENOKOT-S) 8.6-50 MG tablet Take 2 tablets by mouth at bedtime. 20 tablet 0   No facility-administered medications prior to visit.     Medication list after today's encounter   Current Outpatient Medications  Medication Instructions   ALPRAZolam (XANAX) 1 mg, 3 times daily PRN   ampicillin (PRINCIPEN) 500 mg, Oral, 2 times daily   aspirin EC 81 mg, Oral, Daily, Swallow whole.   atenolol-chlorthalidone (TENORETIC) 100-25 MG tablet 1 tablet, Oral, Daily at bedtime   atorvastatin (LIPITOR) 80 mg, Oral, Daily   B Complex-C (B-COMPLEX WITH VITAMIN C) tablet 1 tablet, Daily   escitalopram (LEXAPRO) 20 mg, Oral, Daily   losartan (COZAAR) 50 mg, Oral, Every evening   Lysine 1,000 mg, Oral, Daily   Multiple Vitamins-Minerals (MULTIVITAMIN WITH MINERALS) tablet 1 tablet, Daily   omeprazole (PRILOSEC) 40 mg, Daily   tadalafil (CIALIS) 20 mg, Oral, Daily PRN   traZODone (DESYREL) 100 mg, Oral, Daily at bedtime    Radiology:   CT of the abdomen and pelvis with contrast 04/13/2016: Cirrhotic appearing liver with stable 7 mm right hepatic dome hyper enhancing nodule unchanged in appearance. Mild periportal edema. Recannulized paraumbilical vein. Findings  suggest component of portal hypertension. There is anterior interposition of colon between the liver and abdominal wall extending into the right diaphragm. 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  Cardiac Studies:   Coronary calcium score 04/28/2021: LM 0 LAD 744 LCx: 95 RCA 176 Total coronary calcium score of 2015, MESA database Percentile: 99th. Dilated ascending thoracic aorta measures up to approximately 4.2 cm in greatest diameter. There are some associated calcifications at the level of the aortic valve/annulus. Atherosclerosis of the descending thoracic aorta. Visualized mediastinum and hilar regions demonstrate no lymphadenopathy or masses.   EKG:   EKG 05/01/2021: Normal sinus rhythm at rate of 66 bpm, left atrial enlargement, normal axis.  No evidence of ischemia.    Assessment     ICD-10-CM   1. Coronary artery disease of native artery of native heart with stable angina pectoris (HCC)  I25.118 EKG 12-Lead    PCV ECHOCARDIOGRAM COMPLETE    PCV MYOCARDIAL PERFUSION WITH LEXISCAN    atorvastatin (LIPITOR) 80 MG tablet    Apo A1 + B + Ratio    Lipoprotein A (LPA)    CMP14+EGFR    CMP14+EGFR    2. Elevated coronary artery calcium score 04/28/21:  Score of 2015, MESA database Percentile: 99th  R93.1     3. Aneurysm of ascending aorta without rupture 4.2, CT 04/27/21  I71.21     4. Mixed hyperlipidemia  E78.2 atorvastatin (LIPITOR) 80 MG tablet    losartan (COZAAR) 50 MG tablet    5. Family history of premature CAD  Z82.49        Medications Discontinued During This Encounter  Medication Reason   HYDROcodone-acetaminophen (NORCO) 10-325 MG tablet Error   Omega-3 Fatty Acids (FISH OIL) 1200 MG CAPS Error   ondansetron (ZOFRAN ODT) 4 MG disintegrating tablet Error   oxyCODONE (ROXICODONE) 5 MG immediate release tablet Error   polyethylene glycol (MIRALAX / GLYCOLAX)  packet Error   senna-docusate (SENOKOT-S) 8.6-50 MG tablet Error    Meds ordered this  encounter  Medications   atorvastatin (LIPITOR) 80 MG tablet    Sig: Take 1 tablet (80 mg total) by mouth daily.    Dispense:  30 tablet    Refill:  2   losartan (COZAAR) 50 MG tablet    Sig: Take 1 tablet (50 mg total) by mouth every evening.    Dispense:  30 tablet    Refill:  2    Orders Placed This Encounter  Procedures   Apo A1 + B + Ratio   Lipoprotein A (LPA)   CMP14+EGFR    Standing Status:   Future    Number of Occurrences:   1    Standing Expiration Date:   05/01/2022   PCV MYOCARDIAL PERFUSION WITH LEXISCAN    Standing Status:   Future    Standing Expiration Date:   07/01/2021   EKG 12-Lead   PCV ECHOCARDIOGRAM COMPLETE    Standing Status:   Future    Standing Expiration Date:   05/01/2022   Recommendations:   Christopher Jones is a 60 y.o. Caucasian male patient referred to me on a urgent basis for evaluation of abnormal coronary calcium score and small sized ascending aortic aneurysm at 4.2 cm, test performed on 04/28/2021.  He does run from about 1 mile occasionally.  Initially denied any symptoms but on further questioning, states that occasionally he has very mild chest discomfort while he is active. Past medical history is significant for hypertension, alcoholism with mild cirrhosis of the liver, quit drinking alcohol 6 years ago, very strong family history of premature coronary artery disease with multiple members in his paternal side with coronary artery disease and stents in their 34s, his brother died at age 24 with CAD.  Although symptoms of chest discomfort may be atypical, with such high coronary calcium score I suspect this is probably angina pectoris.  Also his blood pressure is slightly elevated.In view of coronary artery disease, I have started him on high intensity statin with Lipitor 80 mg daily, also added losartan 50 mg for hypertension and for CAD.  Schedule for a Exercise Nuclear stress test to evaluate for myocardial ischemia. Will schedule for an  echocardiogram specifically to follow-up on aortic root dilatation at 4.2 cm noted on the CT scan.  Fortunately abdominal CT scan that was performed in 2017 did not reveal any aortic aneurysm.  Will obtain ApoA-I plus B ratio, LPA levels as well.  He will do this today, in 2 to 3 weeks he will obtain CMP both for follow-up of hepatic enzymes and for renal function.  Extensive discussion with the patient and his wife at the bedside regarding presentations of CAD, they are extremely concerned, we discussed also regarding lifestyle changes including regular exercise and also weight loss in view of patient being mildly obese.  I would like to see him back in 4 to 6 weeks for follow-up.  Signs and symptoms of angina pectoris and unstable angina discussed.  This was a 60-minute office visit/consultation.    Adrian Prows, MD, [facility] 05/01/2021, 3:28 PM Office: [phone]

## 2021-05-02 LAB — CMP14+EGFR
ALT: 20 IU/L (ref 0–44)
AST: 22 IU/L (ref 0–40)
Albumin/Globulin Ratio: 2.1 (ref 1.2–2.2)
Albumin: 4.6 g/dL (ref 3.8–4.9)
Alkaline Phosphatase: 59 IU/L (ref 44–121)
BUN/Creatinine Ratio: 8 — ABNORMAL LOW (ref 9–20)
BUN: 7 mg/dL (ref 6–24)
Bilirubin Total: 0.6 mg/dL (ref 0.0–1.2)
CO2: 30 mmol/L — ABNORMAL HIGH (ref 20–29)
Calcium: 9.7 mg/dL (ref 8.7–10.2)
Chloride: 96 mmol/L (ref 96–106)
Creatinine, Ser: 0.87 mg/dL (ref 0.76–1.27)
Globulin, Total: 2.2 g/dL (ref 1.5–4.5)
Glucose: 104 mg/dL — ABNORMAL HIGH (ref 70–99)
Potassium: 3.7 mmol/L (ref 3.5–5.2)
Sodium: 138 mmol/L (ref 134–144)
Total Protein: 6.8 g/dL (ref 6.0–8.5)
eGFR: 100 mL/min/{1.73_m2} (ref >59)

## 2021-05-03 ENCOUNTER — Other Ambulatory Visit: Payer: Self-pay | Admitting: Cardiology

## 2021-05-03 DIAGNOSIS — I25118 Atherosclerotic heart disease of native coronary artery with other forms of angina pectoris: Secondary | ICD-10-CM

## 2021-05-03 DIAGNOSIS — Z8249 Family history of ischemic heart disease and other diseases of the circulatory system: Secondary | ICD-10-CM

## 2021-05-03 DIAGNOSIS — R931 Abnormal findings on diagnostic imaging of heart and coronary circulation: Secondary | ICD-10-CM

## 2021-06-10 ENCOUNTER — Other Ambulatory Visit: Payer: Self-pay

## 2021-06-10 ENCOUNTER — Ambulatory Visit: Payer: 59

## 2021-06-10 DIAGNOSIS — I25118 Atherosclerotic heart disease of native coronary artery with other forms of angina pectoris: Secondary | ICD-10-CM

## 2021-06-16 NOTE — Progress Notes (Signed)
Aortic root dilatation, will watch this over time. Otherwise normal echo with mild LVH.

## 2021-06-25 ENCOUNTER — Other Ambulatory Visit: Payer: Self-pay

## 2021-06-25 ENCOUNTER — Encounter: Payer: Self-pay | Admitting: Cardiology

## 2021-06-25 ENCOUNTER — Ambulatory Visit: Payer: 59 | Admitting: Cardiology

## 2021-06-25 VITALS — BP 149/96 | HR 63 | Ht 72.0 in | Wt 253.0 lb

## 2021-06-25 DIAGNOSIS — Z8249 Family history of ischemic heart disease and other diseases of the circulatory system: Secondary | ICD-10-CM

## 2021-06-25 DIAGNOSIS — I10 Essential (primary) hypertension: Secondary | ICD-10-CM

## 2021-06-25 DIAGNOSIS — I25118 Atherosclerotic heart disease of native coronary artery with other forms of angina pectoris: Secondary | ICD-10-CM

## 2021-06-25 DIAGNOSIS — E782 Mixed hyperlipidemia: Secondary | ICD-10-CM

## 2021-06-25 MED ORDER — AMLODIPINE BESYLATE 5 MG PO TABS: 5.0000 mg | ORAL_TABLET | Freq: Every day | ORAL | 2 refills | Status: DC

## 2021-06-25 MED ORDER — ATORVASTATIN CALCIUM 80 MG PO TABS: 80.0000 mg | ORAL_TABLET | Freq: Every day | ORAL | 3 refills | Status: DC

## 2021-06-25 MED ORDER — LOSARTAN POTASSIUM 100 MG PO TABS: 100.0000 mg | ORAL_TABLET | Freq: Every evening | ORAL | 3 refills | Status: DC

## 2021-06-25 NOTE — Progress Notes (Signed)
Primary Physician/Referring:  Jonathon Jordan, MD  Patient ID: Christopher Jones, male    DOB: [birth year], 59 y.o.   MRN: [id number]  Chief Complaint  Patient presents with   Coronary Artery Disease   Hyperlipidemia   Hypertension   Follow-up    6 week   HPI:    Christopher Jones  is a 60 y.o. Caucasian male patient with abnormal coronary calcium score and small sized ascending aortic aneurysm at 4.2 cm, test performed on 04/28/2021.  He has very mild chest discomfort while he is active. Past medical history is significant for hypertension, hyperlipidemia, alcoholism with mild cirrhosis of the liver, quit drinking alcohol 2018 years ago, very strong family history of premature coronary artery disease with multiple members in his paternal side with coronary artery disease and stents in their 32s, his brother died at age 84 with CAD.  On his last office visit I started him on high intensity statins which he is tolerating, CMP revealed stable LFTs.  He has had occasional episodes of chest tightness again chest pain symptoms are very atypical sometimes occurring with stressful activity and rarely with exertional activity.  Past Medical History:  Diagnosis Date   Anxiety    GERD (gastroesophageal reflux disease)    H/O ETOH abuse    Hyperlipidemia    Hypertension    Patient is Jehovah's Witness    REFUSES BLOOD PRODUCTS EVEN IN THE EVENT THAT BLOOD WOULD SAVE HIS LIFE   Vertigo    Past Surgical History:  Procedure Laterality Date   CHOLECYSTECTOMY N/A 06/24/2015   Procedure: LAPAROSCOPIC CHOLECYSTECTOMY ;  Surgeon: Stark Klein, MD;  Location: [place];  Service: General;  Laterality: N/A;   SHOULDER ARTHROSCOPY Left 52   VASECTOMY     Family History  Problem Relation Age of Onset   Hypertension Mother    Diabetes Father    Arthritis Father    Heart disease Father 38   CAD Brother 65    Social History   Tobacco Use   Smoking status: Never   Smokeless tobacco: Never   Substance Use Topics   Alcohol use: Not Currently    Comment: Alcoholic and now abstinant since 2016   Marital Status: Married  ROS  Review of Systems  Cardiovascular:  Positive for chest pain. Negative for dyspnea on exertion and leg swelling.  Gastrointestinal:  Negative for melena.  Objective  Blood pressure (!) 149/96, pulse 63, height 6' (1.829 m), weight 253 lb (114.8 kg), SpO2 97 %. Body mass index is 34.31 kg/m.  Vitals with BMI 06/25/2021 06/25/2021 05/01/2021  Height - 6\' 0"  6\' 0"   Weight - 253 lbs 238 lbs 10 oz  BMI - 35.00 93.81  Systolic 829 937 169  Diastolic 96 88 87  Pulse 63 66 68    Physical Exam Neck:     Vascular: No carotid bruit or JVD.  Cardiovascular:     Rate and Rhythm: Normal rate and regular rhythm.     Pulses: Intact distal pulses.     Heart sounds: Normal heart sounds. No murmur heard.   No gallop.  Pulmonary:     Effort: Pulmonary effort is normal.     Breath sounds: Normal breath sounds.  Abdominal:     General: Bowel sounds are normal.     Palpations: Abdomen is soft.  Musculoskeletal:        General: No swelling.     Laboratory examination:   Recent Labs    05/01/21 1115  NA 138  K 3.7  CL 96  CO2 30*  GLUCOSE 104*  BUN 7  CREATININE 0.87  CALCIUM 9.7   CrCl cannot be calculated (Patient's most recent lab result is older than the maximum 21 days allowed.).  CMP Latest Ref Rng & Units 05/01/2021 04/13/2016 06/16/2015  Glucose 70 - 99 mg/dL 104(H) 106(H) 143(H)  BUN 6 - 24 mg/dL 7 10 <5(L)  Creatinine 0.76 - 1.27 mg/dL 0.87 0.77 0.56(L)  Sodium 134 - 144 mmol/L 138 140 142  Potassium 3.5 - 5.2 mmol/L 3.7 3.8 3.4(L)  Chloride 96 - 106 mmol/L 96 100(L) 103  CO2 20 - 29 mmol/L 30(H) 31 26  Calcium 8.7 - 10.2 mg/dL 9.7 10.1 9.7  Total Protein 6.0 - 8.5 g/dL 6.8 8.1 7.1  Total Bilirubin 0.0 - 1.2 mg/dL 0.6 1.2 1.1  Alkaline Phos 44 - 121 IU/L 59 61 139(H)  AST 0 - 40 IU/L 22 29 206(H)  ALT 0 - 44 IU/L 20 31 96(H)   CBC  Latest Ref Rng & Units 04/13/2016 06/16/2015 04/03/2015  WBC 4.0 - 10.5 K/uL 5.7 5.2 10.8(H)  Hemoglobin 13.0 - 17.0 g/dL 17.0 16.1 15.9  Hematocrit 39.0 - 52.0 % 46.7 47.5 45.2  Platelets 150 - 400 K/uL 129(L) 125(L) 180   Lipid Panel No results for input(s): CHOL, TRIG, LDLCALC, VLDL, HDL, CHOLHDL, LDLDIRECT in the last 8760 hours. Lipid Panel  No results found for: CHOL, TRIG, HDL, CHOLHDL, VLDL, LDLCALC, LDLDIRECT, LABVLDL   HEMOGLOBIN A1C No results found for: HGBA1C, MPG TSH No results for input(s): TSH in the last 8760 hours.  External labs:   Cholesterol, total 204.000 m 04/20/2021 HDL 40.000 mg 04/20/2021 LDL 119.000 m 04/20/2021 Triglycerides 264.000 m 04/20/2021  A1C 5.200 % 04/20/2021 TSH 0.440 04/20/2021  Hemoglobin 16.200 g/d 04/20/2021  Creatinine, Serum 0.800 mg/ 04/20/2021 Potassium 3.800 mm 04/20/2021 Magnesium 1.900 mg/ 07/17/2020 ALT (SGPT) 22.000 U/L 04/20/2021 Medications and allergies   Allergies  Allergen Reactions   Shellfish Allergy Anaphylaxis     Medication prior to this encounter:   Outpatient Medications Prior to Visit  Medication Sig Dispense Refill   ALPRAZolam (XANAX) 1 MG tablet Take 1 mg by mouth 3 (three) times daily as needed. For anxiety     ampicillin (PRINCIPEN) 500 MG capsule Take 500 mg by mouth 2 (two) times daily.     aspirin EC 81 MG tablet Take 81 mg by mouth daily. Swallow whole.     atenolol-chlorthalidone (TENORETIC) 100-25 MG tablet Take 1 tablet by mouth at bedtime.      B Complex-C (B-COMPLEX WITH VITAMIN C) tablet Take 1 tablet by mouth daily.     escitalopram (LEXAPRO) 20 MG tablet Take 20 mg by mouth daily.     Lysine 500 MG CAPS Take 1,000 mg by mouth daily.      Multiple Vitamins-Minerals (MULTIVITAMIN WITH MINERALS) tablet Take 1 tablet by mouth daily.     omeprazole (PRILOSEC) 40 MG capsule Take 40 mg by mouth daily.     tadalafil (CIALIS) 20 MG tablet Take 20 mg by mouth daily as needed for erectile  dysfunction.      traZODone (DESYREL) 100 MG tablet Take 100 mg by mouth at bedtime.     atorvastatin (LIPITOR) 80 MG tablet Take 1 tablet (80 mg total) by mouth daily. 30 tablet 2   losartan (COZAAR) 50 MG tablet Take 1 tablet (50 mg total) by mouth every evening. 30 tablet 2   No facility-administered medications prior  to visit.     Medication list after today's encounter   Current Outpatient Medications  Medication Instructions   ALPRAZolam (XANAX) 1 mg, 3 times daily PRN   amLODipine (NORVASC) 5 mg, Oral, Daily   ampicillin (PRINCIPEN) 500 mg, Oral, 2 times daily   aspirin EC 81 mg, Oral, Daily, Swallow whole.   atenolol-chlorthalidone (TENORETIC) 100-25 MG tablet 1 tablet, Oral, Daily at bedtime   atorvastatin (LIPITOR) 80 mg, Oral, Daily   B Complex-C (B-COMPLEX WITH VITAMIN C) tablet 1 tablet, Daily   escitalopram (LEXAPRO) 20 mg, Oral, Daily   losartan (COZAAR) 100 mg, Oral, Every evening   Lysine 1,000 mg, Oral, Daily   Multiple Vitamins-Minerals (MULTIVITAMIN WITH MINERALS) tablet 1 tablet, Daily   omeprazole (PRILOSEC) 40 mg, Daily   tadalafil (CIALIS) 20 mg, Oral, Daily PRN   traZODone (DESYREL) 100 mg, Oral, Daily at bedtime    Radiology:   CT of the abdomen and pelvis with contrast 04/13/2016: Cirrhotic appearing liver with stable 7 mm right hepatic dome hyper enhancing nodule unchanged in appearance. Mild periportal edema. Recannulized paraumbilical vein. Findings suggest component of portal hypertension. There is anterior interposition of colon between the liver and abdominal wall extending into the right diaphragm. 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  Cardiac Studies:   Coronary calcium score 04/28/2021: LM 0 LAD 744 LCx: 95 RCA 176 Total coronary calcium score of 2015, MESA database Percentile: 99th. Dilated ascending thoracic aorta measures up to approximately 4.2 c  PCV MYOCARDIAL PERFUSION WITH LEXISCAN 06/10/2021  Narrative Exercise  Tetrofosmin stress test 06/15/2021: No previous exam available for comparison. Exercise nuclear stress test was performed using Bruce protocol. Patient reached 10.1 METS, and 64% of age predicted maximum heart rate. Because of sub-maximal heart rate response to exercise, patient was injected with a dose of Intravenous Lexiscan. No chest pain reported. Attenuated heart rate response likely due to medications use. Peak exercise blood pressure was not reported. Stress EKG revealed no ischemic changes. Normal myocardial perfusion. Stress LVEF 64%. Low risk study.   PCV ECHOCARDIOGRAM COMPLETE 06/10/2021  Narrative Echocardiogram 06/10/2021: Normal LV systolic function with visual EF 55-60%. Left ventricle cavity is normal in size. Mild left ventricular hypertrophy. Normal global wall motion. Normal diastolic filling pattern, normal LAP. Dilated Aorta (sinotubular junction 33mm and proximal ascending aorta 57mm). No significant valvular heart disease. No prior study for comparison.    EKG:   EKG 05/01/2021: Normal sinus rhythm at rate of 66 bpm, left atrial enlargement, normal axis.  No evidence of ischemia.    Assessment     ICD-10-CM   1. Coronary artery disease of native artery of native heart with stable angina pectoris (HCC)  I25.118 atorvastatin (LIPITOR) 80 MG tablet    amLODipine (NORVASC) 5 MG tablet    2. Family history of premature CAD  Z82.49     3. Mixed hyperlipidemia  E78.2 losartan (COZAAR) 100 MG tablet    atorvastatin (LIPITOR) 80 MG tablet    Lipid Panel With LDL/HDL Ratio    4. Primary hypertension  I10        Medications Discontinued During This Encounter  Medication Reason   atorvastatin (LIPITOR) 80 MG tablet    losartan (COZAAR) 50 MG tablet Reorder    Meds ordered this encounter  Medications   losartan (COZAAR) 100 MG tablet    Sig: Take 1 tablet (100 mg total) by mouth every evening.    Dispense:  100 tablet    Refill:  3  atorvastatin (LIPITOR) 80  MG tablet    Sig: Take 1 tablet (80 mg total) by mouth daily.    Dispense:  100 tablet    Refill:  3   amLODipine (NORVASC) 5 MG tablet    Sig: Take 1 tablet (5 mg total) by mouth daily.    Dispense:  30 tablet    Refill:  2    Orders Placed This Encounter  Procedures   Lipid Panel With LDL/HDL Ratio   Recommendations:   DAVIER TRAMELL is a 60 y.o. Caucasian male patient with abnormal coronary calcium score and small sized ascending aortic aneurysm at 4.2 cm, test performed on 04/28/2021.  He has very mild chest discomfort while he is active. Past medical history is significant for hypertension, hyperlipidemia, alcoholism with mild cirrhosis of the liver, quit drinking alcohol 2018 years ago, very strong family history of premature coronary artery disease with multiple members in his paternal side with coronary artery disease and stents in their 45s, his brother died at age 3 with CAD.  On his last office visit I started him on high intensity statins which he is tolerating, CMP revealed stable LFTs.  I had an extensive discussion with the patient regarding the echocardiogram which correlates well with aortic root enlargement which became continue to follow on annual basis by echocardiogram and serial radiation exposure.  With regard to nuclear stress test, low risk.  He had chronotropic incompetence due to being on beta-blocker therapy.  However his symptoms of exertional chest pain are typical for angina pectoris.  We discussed regarding medical therapy versus invasive strategy in patients with stable coronary artery disease including discussions regarding the clinical trials.  Patient is pleased to continue with medical therapy for now.  Blood pressure is elevated, will increase losartan to 100 g daily.  I will also add amlodipine 5 mg daily both of angina pectoris and hypertension.  Continue atenolol and chlorthalidone combination.  He will need lipid profile testing and I requested to  obtain ApoA-I plus B ratio, LPA levels as well but was not previously done.    Sublingual nitroglycerin was not prescribed as patient did not want this on and he likes to use Cialis or Viagra and does not want to have any interaction.  I have discussed with him regarding unstable anginal symptoms versus stable angina pectoris, indications for coronary angiography.  I discussed regarding weight loss and regular exercise.  Patient was pleased with the evaluation.  I will see him back in 3 months for follow-up.  This was a 40-minute office visit encounter.   Adrian Prows, MD, [facility] 06/25/2021, 4:26 PM Office: [phone]

## 2021-08-21 LAB — LIPID PANEL WITH LDL/HDL RATIO
Cholesterol, Total: 126 mg/dL (ref 100–199)
HDL: 42 mg/dL (ref >39)
LDL Chol Calc (NIH): 52 mg/dL (ref 0–99)
LDL/HDL Ratio: 1.2 ratio (ref 0.0–3.6)
Triglycerides: 196 mg/dL — ABNORMAL HIGH (ref 0–149)
VLDL Cholesterol Cal: 32 mg/dL (ref 5–40)

## 2021-08-31 ENCOUNTER — Ambulatory Visit: Payer: 59 | Admitting: Cardiology

## 2021-09-02 ENCOUNTER — Encounter: Payer: Self-pay | Admitting: Cardiology

## 2021-09-02 ENCOUNTER — Other Ambulatory Visit: Payer: Self-pay

## 2021-09-02 ENCOUNTER — Ambulatory Visit: Payer: 59 | Admitting: Cardiology

## 2021-09-02 DIAGNOSIS — I25118 Atherosclerotic heart disease of native coronary artery with other forms of angina pectoris: Secondary | ICD-10-CM

## 2021-09-02 DIAGNOSIS — E782 Mixed hyperlipidemia: Secondary | ICD-10-CM

## 2021-09-02 MED ORDER — AMLODIPINE BESYLATE 5 MG PO TABS: 5.0000 mg | ORAL_TABLET | Freq: Every day | ORAL | 3 refills | Status: DC

## 2021-09-02 NOTE — Progress Notes (Signed)
Primary Physician/Referring:  Jonathon Jordan, MD  Patient ID: Christopher Jones, male    DOB: 15-Oct-1960, 61 y.o.   MRN: [id number]  Chief Complaint  Patient presents with   Chest Pain   Follow-up   HPI:    Christopher Jones  is a 61 y.o. Caucasian male patient with abnormal coronary calcium score and small sized ascending aortic aneurysm at 4.2 cm, test performed on 04/28/2021.  He has very mild chest discomfort while he is active. Past medical history is significant for hypertension, hyperlipidemia, alcoholism with mild cirrhosis of the liver, quit drinking alcohol 2018 years ago, very strong family history of premature coronary artery disease with multiple members in his paternal side with coronary artery disease and stents in their 67s, his brother died at age 33 with CAD. He presents for a 81-monthoffice visit, since addition of amlodipine 5 mg and increasing atorvastatin to 80 mg, he has not had any further episodes of significant angina pectoris.  He just returned from a 1 month business trip, both overseas and within [place]  He is trying his best to make lifestyle changes.  Underwent labs and presents for follow-up.  Past Medical History:  Diagnosis Date   Anxiety    GERD (gastroesophageal reflux disease)    H/O ETOH abuse    Hyperlipidemia    Hypertension    Patient is Jehovah's Witness    REFUSES BLOOD PRODUCTS EVEN IN THE EVENT THAT BLOOD WOULD SAVE HIS LIFE   Vertigo    Past Surgical History:  Procedure Laterality Date   CHOLECYSTECTOMY N/A 06/24/2015   Procedure: LAPAROSCOPIC CHOLECYSTECTOMY ;  Surgeon: FStark Klein MD;  [place] [facility] [place]  Service: General;  Laterality: N/A;   SHOULDER ARTHROSCOPY Left 127  VASECTOMY     Family History  Problem Relation Age of Onset   Hypertension Mother    Diabetes Father    Arthritis Father    Heart disease Father 575  CAD Brother 536   Social History   Tobacco Use   Smoking status: Never   Smokeless tobacco: Never   Substance Use Topics   Alcohol use: Not Currently    Comment: Alcoholic and now abstinant since 2016   Marital Status: Married  ROS  Review of Systems  Cardiovascular:  Positive for chest pain. Negative for dyspnea on exertion and leg swelling.  Gastrointestinal:  Negative for melena.  Objective  Blood pressure 129/80, pulse 71, temperature 98.6 F (37 C), temperature source Temporal, resp. rate 16, height 6' (1.829 m), weight 248 lb 12.8 oz (112.9 kg), SpO2 97 %. Body mass index is 33.74 kg/m.  Vitals with BMI 09/02/2021 06/25/2021 06/25/2021  Height '6\' 0"'$  - '6\' 0"'$   Weight 248 lbs 13 oz - 253 lbs  BMI 375.17- 300.17 Systolic [id number] Diastolic 80 96 88  Pulse 71 63 66    Physical Exam Neck:     Vascular: No carotid bruit or JVD.  Cardiovascular:     Rate and Rhythm: Normal rate and regular rhythm.     Pulses: Intact distal pulses.     Heart sounds: Normal heart sounds. No murmur heard.   No gallop.  Pulmonary:     Effort: Pulmonary effort is normal.     Breath sounds: Normal breath sounds.  Abdominal:     General: Bowel sounds are normal.     Palpations: Abdomen is soft.  Musculoskeletal:        General: No swelling.  Laboratory examination:   Recent Labs    05/01/21 1115  NA 138  K 3.7  CL 96  CO2 30*  GLUCOSE 104*  BUN 7  CREATININE 0.87  CALCIUM 9.7   CrCl cannot be calculated (Patient's most recent lab result is older than the maximum 21 days allowed.).  CMP Latest Ref Rng & Units 05/01/2021 04/13/2016 06/16/2015  Glucose 70 - 99 mg/dL 104(H) 106(H) 143(H)  BUN 6 - 24 mg/dL 7 10 <5(L)  Creatinine 0.76 - 1.27 mg/dL 0.87 0.77 0.56(L)  Sodium 134 - 144 mmol/L 138 140 142  Potassium 3.5 - 5.2 mmol/L 3.7 3.8 3.4(L)  Chloride 96 - 106 mmol/L 96 100(L) 103  CO2 20 - 29 mmol/L 30(H) 31 26  Calcium 8.7 - 10.2 mg/dL 9.7 10.1 9.7  Total Protein 6.0 - 8.5 g/dL 6.8 8.1 7.1  Total Bilirubin 0.0 - 1.2 mg/dL 0.6 1.2 1.1  Alkaline Phos 44 - 121 IU/L 59 61  139(H)  AST 0 - 40 IU/L 22 29 206(H)  ALT 0 - 44 IU/L 20 31 96(H)   CBC Latest Ref Rng & Units 04/13/2016 06/16/2015 04/03/2015  WBC 4.0 - 10.5 K/uL 5.7 5.2 10.8(H)  Hemoglobin 13.0 - 17.0 g/dL 17.0 16.1 15.9  Hematocrit 39.0 - 52.0 % 46.7 47.5 45.2  Platelets 150 - 400 K/uL 129(L) 125(L) 180   Lipid Panel Recent Labs    08/20/21 1349  CHOL 126  TRIG 196*  LDLCALC 52  HDL 42   Lipid Panel     Component Value Date/Time   CHOL 126 08/20/2021 1349   TRIG 196 (H) 08/20/2021 1349   HDL 42 08/20/2021 1349   LDLCALC 52 08/20/2021 1349   LABVLDL 32 08/20/2021 1349     HEMOGLOBIN A1C No results found for: HGBA1C, MPG TSH No results for input(s): TSH in the last 8760 hours.  External labs:   Cholesterol, total 204.000 m 04/20/2021 HDL 40.000 mg 04/20/2021 LDL 119.000 m 04/20/2021 Triglycerides 264.000 m 04/20/2021  A1C 5.200 % 04/20/2021 TSH 0.440 04/20/2021  Hemoglobin 16.200 g/d 04/20/2021  Creatinine, Serum 0.800 mg/ 04/20/2021 Potassium 3.800 mm 04/20/2021 Magnesium 1.900 mg/ 07/17/2020 ALT (SGPT) 22.000 U/L 04/20/2021 Medications and allergies   Allergies  Allergen Reactions   Shellfish Allergy Anaphylaxis    Medication list after today's encounter    Current Outpatient Medications:    ALPRAZolam (XANAX) 1 MG tablet, Take 1 mg by mouth 3 (three) times daily as needed. For anxiety, Disp: , Rfl:    ampicillin (PRINCIPEN) 500 MG capsule, Take 500 mg by mouth 2 (two) times daily., Disp: , Rfl:    aspirin EC 81 MG tablet, Take 81 mg by mouth daily. Swallow whole., Disp: , Rfl:    atenolol-chlorthalidone (TENORETIC) 100-25 MG tablet, Take 1 tablet by mouth at bedtime. , Disp: , Rfl:    atorvastatin (LIPITOR) 80 MG tablet, Take 1 tablet (80 mg total) by mouth daily., Disp: 100 tablet, Rfl: 3   B Complex-C (B-COMPLEX WITH VITAMIN C) tablet, Take 1 tablet by mouth daily., Disp: , Rfl:    escitalopram (LEXAPRO) 20 MG tablet, Take 20 mg by mouth daily., Disp: , Rfl:     losartan (COZAAR) 100 MG tablet, Take 1 tablet (100 mg total) by mouth every evening., Disp: 100 tablet, Rfl: 3   Lysine 500 MG CAPS, Take 1,000 mg by mouth daily. , Disp: , Rfl:    Multiple Vitamins-Minerals (MULTIVITAMIN WITH MINERALS) tablet, Take 1 tablet by mouth daily., Disp: , Rfl:  omeprazole (PRILOSEC) 40 MG capsule, Take 40 mg by mouth daily., Disp: , Rfl:    tadalafil (CIALIS) 20 MG tablet, Take 20 mg by mouth daily as needed for erectile dysfunction. , Disp: , Rfl:    traZODone (DESYREL) 100 MG tablet, Take 100 mg by mouth at bedtime., Disp: , Rfl:    amLODipine (NORVASC) 5 MG tablet, Take 1 tablet (5 mg total) by mouth daily., Disp: 100 tablet, Rfl: 3   Radiology:   CT of the abdomen and pelvis with contrast 04/13/2016: Cirrhotic appearing liver with stable 7 mm right hepatic dome hyper enhancing nodule unchanged in appearance. Mild periportal edema. Recannulized paraumbilical vein. Findings suggest component of portal hypertension. There is anterior interposition of colon between the liver and abdominal wall extending into the right diaphragm. 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  Cardiac Studies:   Coronary calcium score 04/28/2021: LM 0 LAD 744 LCx: 95 RCA 176 Total coronary calcium score of 2015, MESA database Percentile: 99th. Dilated ascending thoracic aorta measures up to approximately 4.2 c  PCV MYOCARDIAL PERFUSION WITH LEXISCAN 06/10/2021  Exercise nuclear stress test was performed using Bruce protocol. Patient reached 10.1 METS, and 64% of age predicted maximum heart rate. Because of sub-maximal heart rate response to exercise, patient was injected with a dose of Intravenous Lexiscan. No chest pain reported. Attenuated heart rate response likely due to medications use. Peak exercise blood pressure was not reported. Stress EKG revealed no ischemic changes. Normal myocardial perfusion. Stress LVEF 64%. Low risk study.   PCV ECHOCARDIOGRAM  COMPLETE 06/10/2021  Normal LV systolic function with visual EF 55-60%. Left ventricle cavity is normal in size. Mild left ventricular hypertrophy. Normal global wall motion. Normal diastolic filling pattern, normal LAP. Dilated Aorta (sinotubular junction 31m and proximal ascending aorta 435m. No significant valvular heart disease. No prior study for comparison.    EKG:   EKG 05/01/2021: Normal sinus rhythm at rate of 66 bpm, left atrial enlargement, normal axis.  No evidence of ischemia.    Assessment     ICD-10-CM   1. Coronary artery disease of native artery of native heart with stable angina pectoris (HCC)  I25.118 amLODipine (NORVASC) 5 MG tablet    2. Mixed hyperlipidemia  E78.2       Medications Discontinued During This Encounter  Medication Reason   amLODipine (NORVASC) 5 MG tablet Reorder    Meds ordered this encounter  Medications   amLODipine (NORVASC) 5 MG tablet    Sig: Take 1 tablet (5 mg total) by mouth daily.    Dispense:  100 tablet    Refill:  3    No orders of the defined types were placed in this encounter.  Recommendations:   Christopher EARHARTs a 89.o. Caucasian male patient with abnormal coronary calcium score and small sized ascending aortic aneurysm at 4.2 cm, test performed on 04/28/2021.  He has very mild chest discomfort while he is active. Past medical history is significant for hypertension, hyperlipidemia, alcoholism with mild cirrhosis of the liver, quit drinking alcohol 2018 years ago, very strong family history of premature coronary artery disease with multiple members in his paternal side with coronary artery disease and stents in their 5054shis brother died at age 7934ith CAD.  He presents for a 3-60-monthfice visit.  Since increasing the dose of statins and also adding amlodipine, his anginal symptoms are improved significantly.  I discussed with him regarding his recent lipid profile testing, mixed hyperlipidemia with elevated  triglycerides discussed and I advised him that this is clearly due to his poor diet and excessive intake of carbohydrates and fats including excessive amounts of cheese and pork products.  Weight loss again discussed with the patient.  Patient is willing to make changes to this.  Otherwise stable from cardiac standpoint, I will see him back in a year or sooner if problems.    Christopher Prows, MD, [facility] 09/02/2021, 6:26 PM Office: [phone]

## 2021-11-12 ENCOUNTER — Other Ambulatory Visit: Payer: Self-pay | Admitting: Physician Assistant

## 2021-11-12 DIAGNOSIS — K7469 Other cirrhosis of liver: Secondary | ICD-10-CM

## 2021-11-19 ENCOUNTER — Ambulatory Visit
Admission: RE | Admit: 2021-11-19 | Discharge: 2021-11-19 | Disposition: A | Discharge status: Home / Self Care | Payer: 59 | Source: Ambulatory Visit | Attending: Physician Assistant | Admitting: Physician Assistant

## 2021-11-19 DIAGNOSIS — K7469 Other cirrhosis of liver: Secondary | ICD-10-CM

## 2022-03-21 ENCOUNTER — Other Ambulatory Visit: Payer: Self-pay | Admitting: Cardiology

## 2022-03-21 DIAGNOSIS — I25118 Atherosclerotic heart disease of native coronary artery with other forms of angina pectoris: Secondary | ICD-10-CM

## 2022-03-21 DIAGNOSIS — E782 Mixed hyperlipidemia: Secondary | ICD-10-CM

## 2022-05-27 ENCOUNTER — Other Ambulatory Visit: Payer: Self-pay

## 2022-05-27 DIAGNOSIS — I25118 Atherosclerotic heart disease of native coronary artery with other forms of angina pectoris: Secondary | ICD-10-CM

## 2022-05-27 MED ORDER — AMLODIPINE BESYLATE 5 MG PO TABS: 5.0000 mg | ORAL_TABLET | Freq: Every day | ORAL | 3 refills | Status: AC

## 2022-08-24 ENCOUNTER — Other Ambulatory Visit: Payer: Self-pay

## 2022-08-24 DIAGNOSIS — I25118 Atherosclerotic heart disease of native coronary artery with other forms of angina pectoris: Secondary | ICD-10-CM

## 2022-08-24 DIAGNOSIS — E782 Mixed hyperlipidemia: Secondary | ICD-10-CM

## 2022-08-24 MED ORDER — LOSARTAN POTASSIUM 100 MG PO TABS: 100.0000 mg | ORAL_TABLET | Freq: Every evening | ORAL | 0 refills | Status: AC

## 2022-08-24 MED ORDER — ATORVASTATIN CALCIUM 80 MG PO TABS: 80.0000 mg | ORAL_TABLET | Freq: Every day | ORAL | 0 refills | Status: AC

## 2022-09-02 ENCOUNTER — Encounter: Payer: Self-pay | Admitting: Cardiology

## 2022-09-02 ENCOUNTER — Ambulatory Visit: Payer: 59 | Admitting: Cardiology

## 2022-09-02 VITALS — BP 123/81 | HR 71 | Resp 16 | Ht 72.0 in | Wt 251.2 lb

## 2022-09-02 DIAGNOSIS — R0609 Other forms of dyspnea: Secondary | ICD-10-CM

## 2022-09-02 DIAGNOSIS — I10 Essential (primary) hypertension: Secondary | ICD-10-CM

## 2022-09-02 DIAGNOSIS — I25118 Atherosclerotic heart disease of native coronary artery with other forms of angina pectoris: Secondary | ICD-10-CM

## 2022-09-02 DIAGNOSIS — I7121 Aneurysm of the ascending aorta, without rupture: Secondary | ICD-10-CM

## 2022-09-02 DIAGNOSIS — E782 Mixed hyperlipidemia: Secondary | ICD-10-CM

## 2022-09-02 NOTE — H&P (View-Only) (Signed)
 Primary Physician/Referring:  Wolters, Sharon, MD  Patient ID: Christopher Jones, male    DOB: 01/15/1961, 62 y.o.   MRN: [id number]  Chief Complaint  Patient presents with   Coronary Artery Disease   Follow-up    1 year   HPI:    Christopher Jones  is a 62 y.o. Caucasian male patient with family history of premature coronary disease, coronary calcium score in the 99th percentile on 04/28/2021 with a negative nuclear stress test on 06/10/2021, mild aortic root ascending aortic dilatation, hypertension and hyperlipidemia presents here for annual visit.  Patient presents for annual visit, has been having worsening dyspnea on exertion, also continues to have exertional chest pain.  No PND or orthopnea.  He has been concerned about decreasing physical endurance and also symptoms.  He has been compliant with all his medications.  Past Medical History:  Diagnosis Date   Anxiety    GERD (gastroesophageal reflux disease)    H/O ETOH abuse    Hyperlipidemia    Hypertension    Patient is Jehovah's Witness    REFUSES BLOOD PRODUCTS EVEN IN THE EVENT THAT BLOOD WOULD SAVE HIS LIFE   Vertigo    Past Surgical History:  Procedure Laterality Date   CHOLECYSTECTOMY N/A 06/24/2015   Procedure: LAPAROSCOPIC CHOLECYSTECTOMY ;  Surgeon: Faera Byerly, MD;  Location: [facility] OR;  Service: General;  Laterality: N/A;   SHOULDER ARTHROSCOPY Left 13   VASECTOMY     Family History  Problem Relation Age of Onset   Hypertension Mother    Diabetes Father    Arthritis Father    Heart disease Father 50   CAD Brother 52    Social History   Tobacco Use   Smoking status: Never   Smokeless tobacco: Never  Substance Use Topics   Alcohol use: Yes    Comment: Alcoholic and now abstinant since 2016   Marital Status: Married  ROS  Review of Systems  Cardiovascular:  Positive for chest pain and dyspnea on exertion. Negative for leg swelling.  Gastrointestinal:  Negative for melena.   Objective  Blood  pressure 123/81, pulse 71, resp. rate 16, height 6' (1.829 m), weight 251 lb 3.2 oz (113.9 kg), SpO2 97 %. Body mass index is 34.07 kg/m.     09/02/2022   10:12 AM 09/02/2021    3:11 PM 06/25/2021    8:45 AM  Vitals with BMI  Height 6' 0" 6' 0"   Weight 251 lbs 3 oz 248 lbs 13 oz   BMI 34.06 33.74   Systolic 123 129 149  Diastolic 81 80 96  Pulse 71 71 63    Physical Exam Neck:     Vascular: No carotid bruit or JVD.  Cardiovascular:     Rate and Rhythm: Normal rate and regular rhythm.     Pulses: Intact distal pulses.     Heart sounds: Normal heart sounds. No murmur heard.    No gallop.  Pulmonary:     Effort: Pulmonary effort is normal.     Breath sounds: Normal breath sounds.  Abdominal:     General: Bowel sounds are normal.     Palpations: Abdomen is soft.  Musculoskeletal:        General: No swelling.      Laboratory examination:  Lipid Panel     Component Value Date/Time   CHOL 126 08/20/2021 1349   TRIG 196 (H) 08/20/2021 1349   HDL 42 08/20/2021 1349   LDLCALC 52 08/20/2021 1349     LABVLDL 32 08/20/2021 1349    External labs:   Cholesterol, total 204.000 m 04/20/2021 HDL 40.000 mg 04/20/2021 LDL 119.000 m 04/20/2021 Triglycerides 264.000 m 04/20/2021  A1C 5.200 % 04/20/2021 TSH 0.440 04/20/2021  Hemoglobin 16.200 g/d 04/20/2021  Creatinine, Serum 0.800 mg/ 04/20/2021 Potassium 3.800 mm 04/20/2021 Magnesium 1.900 mg/ 07/17/2020 ALT (SGPT) 22.000 U/L 04/20/2021 Medications and allergies   Allergies  Allergen Reactions   Shellfish Allergy Anaphylaxis    Medication list after today's encounter    Current Outpatient Medications:    ALPRAZolam (XANAX) 1 MG tablet, Take 1 mg by mouth 3 (three) times daily as needed. For anxiety, Disp: , Rfl:    amLODipine (NORVASC) 5 MG tablet, Take 1 tablet (5 mg total) by mouth daily., Disp: 90 tablet, Rfl: 3   ampicillin (PRINCIPEN) 500 MG capsule, Take 500 mg by mouth 2 (two) times daily., Disp: , Rfl:    aspirin  EC 81 MG tablet, Take 81 mg by mouth daily. Swallow whole., Disp: , Rfl:    atenolol-chlorthalidone (TENORETIC) 100-25 MG tablet, Take 1 tablet by mouth at bedtime. , Disp: , Rfl:    atorvastatin (LIPITOR) 80 MG tablet, Take 1 tablet (80 mg total) by mouth daily., Disp: 90 tablet, Rfl: 0   B Complex-C (B-COMPLEX WITH VITAMIN C) tablet, Take 1 tablet by mouth daily., Disp: , Rfl:    escitalopram (LEXAPRO) 20 MG tablet, Take 20 mg by mouth daily., Disp: , Rfl:    losartan (COZAAR) 100 MG tablet, Take 1 tablet (100 mg total) by mouth every evening., Disp: 90 tablet, Rfl: 0   Lysine 500 MG CAPS, Take 1,000 mg by mouth daily. , Disp: , Rfl:    Multiple Vitamins-Minerals (MULTIVITAMIN WITH MINERALS) tablet, Take 1 tablet by mouth daily., Disp: , Rfl:    omeprazole (PRILOSEC) 40 MG capsule, Take 40 mg by mouth daily., Disp: , Rfl:    tadalafil (CIALIS) 20 MG tablet, Take 20 mg by mouth daily as needed for erectile dysfunction. , Disp: , Rfl:    traZODone (DESYREL) 100 MG tablet, Take 100 mg by mouth at bedtime., Disp: , Rfl:    Radiology:   CT of the abdomen and pelvis with contrast 04/13/2016: Cirrhotic appearing liver with stable 7 mm right hepatic dome hyper enhancing nodule unchanged in appearance. Mild periportal edema. Recannulized paraumbilical vein. Findings suggest component of portal hypertension. There is anterior interposition of colon between the liver and abdominal wall extending into the right diaphragm. 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  Cardiac Studies:   Coronary calcium score 04/28/2021: LM 0 LAD 744 LCx: 95 RCA 176 Total coronary calcium score of 2015, MESA database Percentile: 99th. Dilated ascending thoracic aorta measures up to approximately 4.2 c  PCV MYOCARDIAL PERFUSION WITH LEXISCAN 06/10/2021  Exercise nuclear stress test was performed using Bruce protocol. Patient reached 10.1 METS, and 64% of age predicted maximum heart rate. Because of  sub-maximal heart rate response to exercise, patient was injected with a dose of Intravenous Lexiscan. No chest pain reported. Attenuated heart rate response likely due to medications use. Peak exercise blood pressure was not reported. Stress EKG revealed no ischemic changes. Normal myocardial perfusion. Stress LVEF 64%. Low risk study.   PCV ECHOCARDIOGRAM COMPLETE 06/10/2021  Normal LV systolic function with visual EF 55-60%. Left ventricle cavity is normal in size. Mild left ventricular hypertrophy. Normal global wall motion. Normal diastolic filling pattern, normal LAP. Dilated Aorta (sinotubular junction 38mm and proximal ascending aorta 40mm). No significant valvular heart   disease. No prior study for comparison.    EKG:   EKG 09/02/2022: Normal sinus rhythm at rate of 68 bpm, normal axis, nonspecific IVCD.  Normal EKG.  No significant change compared to 05/01/2021.  Assessment     ICD-10-CM   1. Coronary artery disease of native artery of native heart with stable angina pectoris (HCC)  I25.118 EKG 12-Lead    2. Dyspnea on exertion  R06.09 PCV ECHOCARDIOGRAM COMPLETE    3. Mixed hyperlipidemia  E78.2     4. Primary hypertension  I10     5. Aneurysm of ascending aorta without rupture 4.2, CT 04/27/21  I71.21 PCV ECHOCARDIOGRAM COMPLETE      There are no discontinued medications.   No orders of the defined types were placed in this encounter.   Orders Placed This Encounter  Procedures   EKG 12-Lead   PCV ECHOCARDIOGRAM COMPLETE    Standing Status:   Future    Standing Expiration Date:   09/02/2023   Recommendations:   Christopher Jones is a 61 y.o. Caucasian male patient with family history of premature coronary disease, coronary calcium score in the 99th percentile on 04/28/2021 with a negative nuclear stress test on 06/10/2021, mild aortic root ascending aortic dilatation, hypertension and hyperlipidemia presents here for annual visit. Multiple members in his paternal side  with coronary artery disease and stents in their 50s, his brother died at age 52 with CAD.  1. Coronary artery disease of native artery of native heart with stable angina pectoris (HCC) Patient presents for an annual visit.  He continues to have stable angina pectoris but recently has noticed marked decrease in his exercise capacity and marked dyspnea when he is on the treadmill and also when he was in high altitude, his heart rate went up to 150 bpm, he was markedly dyspneic and felt very uncomfortable.  His symptoms are very concerning for significant CAD, in view of patient being on appropriate medical therapy, we will proceed with left heart catheterization and possible coronary angioplasty.  Schedule for cardiac catheterization, and possible angioplasty. We discussed regarding risks, benefits, alternatives to this including stress testing, CTA and continued medical therapy. Patient wants to proceed. Understands <1-2% risk of death, stroke, MI, urgent CABG, bleeding, infection, renal failure but not limited to these.   2. Mixed hyperlipidemia Patient needs labs, he is going to contact Dr. Sharon Wolters to have his annual labs done, and also requested to perform LP.  3. Primary hypertension Patient is on appropriate and guideline directed medical therapy, blood pressure is well-controlled.  4. Aneurysm of ascending aorta without rupture 4.2, CT 04/27/21 He has ascending aortic dilatation, he will need repeat echocardiogram which I ordered today.  I will see him back in 6 to 8 weeks, he travels a lot on his job, he is going to [place] next week, will have to schedule his procedure between his schedules.   Christopher Odonovan, MD, FACC 09/02/2022, 10:46 AM Office: [phone]  

## 2022-09-02 NOTE — Progress Notes (Signed)
Primary Physician/Referring:  Jonathon Jordan, MD  Patient ID: Christopher Jones, male    DOB: 1961-01-17, 62 y.o.   MRN: [id number]  Chief Complaint  Patient presents with   Coronary Artery Disease   Follow-up    1 year   HPI:    Christopher Jones  is a 62 y.o. Caucasian male patient with family history of premature coronary disease, coronary calcium score in the 99th percentile on 04/28/2021 with a negative nuclear stress test on 06/10/2021, mild aortic root ascending aortic dilatation, hypertension and hyperlipidemia presents here for annual visit.  Patient presents for annual visit, has been having worsening dyspnea on exertion, also continues to have exertional chest pain.  No PND or orthopnea.  He has been concerned about decreasing physical endurance and also symptoms.  He has been compliant with all his medications.  Past Medical History:  Diagnosis Date   Anxiety    GERD (gastroesophageal reflux disease)    H/O ETOH abuse    Hyperlipidemia    Hypertension    Patient is Jehovah's Witness    REFUSES BLOOD PRODUCTS EVEN IN THE EVENT THAT BLOOD WOULD SAVE HIS LIFE   Vertigo    Past Surgical History:  Procedure Laterality Date   CHOLECYSTECTOMY N/A 06/24/2015   Procedure: LAPAROSCOPIC CHOLECYSTECTOMY ;  Surgeon: Stark Klein, MD;  Location: [facility] OR;  Service: General;  Laterality: N/A;   SHOULDER ARTHROSCOPY Left 73   VASECTOMY     Family History  Problem Relation Age of Onset   Hypertension Mother    Diabetes Father    Arthritis Father    Heart disease Father 33   CAD Brother 25    Social History   Tobacco Use   Smoking status: Never   Smokeless tobacco: Never  Substance Use Topics   Alcohol use: Yes    Comment: Alcoholic and now abstinant since 2016   Marital Status: Married  ROS  Review of Systems  Cardiovascular:  Positive for chest pain and dyspnea on exertion. Negative for leg swelling.  Gastrointestinal:  Negative for melena.   Objective  Blood  pressure 123/81, pulse 71, resp. rate 16, height 6' (1.829 m), weight 251 lb 3.2 oz (113.9 kg), SpO2 97 %. Body mass index is 34.07 kg/m.     09/02/2022   10:12 AM 09/02/2021    3:11 PM 06/25/2021    8:45 AM  Vitals with BMI  Height '6\' 0"'$  '6\' 0"'$    Weight 251 lbs 3 oz 248 lbs 13 oz   BMI [id number]   Systolic [id number]  Diastolic 81 80 96  Pulse 71 71 63    Physical Exam Neck:     Vascular: No carotid bruit or JVD.  Cardiovascular:     Rate and Rhythm: Normal rate and regular rhythm.     Pulses: Intact distal pulses.     Heart sounds: Normal heart sounds. No murmur heard.    No gallop.  Pulmonary:     Effort: Pulmonary effort is normal.     Breath sounds: Normal breath sounds.  Abdominal:     General: Bowel sounds are normal.     Palpations: Abdomen is soft.  Musculoskeletal:        General: No swelling.      Laboratory examination:  Lipid Panel     Component Value Date/Time   CHOL 126 08/20/2021 1349   TRIG 196 (H) 08/20/2021 1349   HDL 42 08/20/2021 1349   LDLCALC 52 08/20/2021 1349  LABVLDL 32 08/20/2021 1349    External labs:   Cholesterol, total 204.000 m 04/20/2021 HDL 40.000 mg 04/20/2021 LDL 119.000 m 04/20/2021 Triglycerides 264.000 m 04/20/2021  A1C 5.200 % 04/20/2021 TSH 0.440 04/20/2021  Hemoglobin 16.200 g/d 04/20/2021  Creatinine, Serum 0.800 mg/ 04/20/2021 Potassium 3.800 mm 04/20/2021 Magnesium 1.900 mg/ 07/17/2020 ALT (SGPT) 22.000 U/L 04/20/2021 Medications and allergies   Allergies  Allergen Reactions   Shellfish Allergy Anaphylaxis    Medication list after today's encounter    Current Outpatient Medications:    ALPRAZolam (XANAX) 1 MG tablet, Take 1 mg by mouth 3 (three) times daily as needed. For anxiety, Disp: , Rfl:    amLODipine (NORVASC) 5 MG tablet, Take 1 tablet (5 mg total) by mouth daily., Disp: 90 tablet, Rfl: 3   ampicillin (PRINCIPEN) 500 MG capsule, Take 500 mg by mouth 2 (two) times daily., Disp: , Rfl:    aspirin  EC 81 MG tablet, Take 81 mg by mouth daily. Swallow whole., Disp: , Rfl:    atenolol-chlorthalidone (TENORETIC) 100-25 MG tablet, Take 1 tablet by mouth at bedtime. , Disp: , Rfl:    atorvastatin (LIPITOR) 80 MG tablet, Take 1 tablet (80 mg total) by mouth daily., Disp: 90 tablet, Rfl: 0   B Complex-C (B-COMPLEX WITH VITAMIN C) tablet, Take 1 tablet by mouth daily., Disp: , Rfl:    escitalopram (LEXAPRO) 20 MG tablet, Take 20 mg by mouth daily., Disp: , Rfl:    losartan (COZAAR) 100 MG tablet, Take 1 tablet (100 mg total) by mouth every evening., Disp: 90 tablet, Rfl: 0   Lysine 500 MG CAPS, Take 1,000 mg by mouth daily. , Disp: , Rfl:    Multiple Vitamins-Minerals (MULTIVITAMIN WITH MINERALS) tablet, Take 1 tablet by mouth daily., Disp: , Rfl:    omeprazole (PRILOSEC) 40 MG capsule, Take 40 mg by mouth daily., Disp: , Rfl:    tadalafil (CIALIS) 20 MG tablet, Take 20 mg by mouth daily as needed for erectile dysfunction. , Disp: , Rfl:    traZODone (DESYREL) 100 MG tablet, Take 100 mg by mouth at bedtime., Disp: , Rfl:    Radiology:   CT of the abdomen and pelvis with contrast 04/13/2016: Cirrhotic appearing liver with stable 7 mm right hepatic dome hyper enhancing nodule unchanged in appearance. Mild periportal edema. Recannulized paraumbilical vein. Findings suggest component of portal hypertension. There is anterior interposition of colon between the liver and abdominal wall extending into the right diaphragm. 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  Cardiac Studies:   Coronary calcium score 04/28/2021: LM 0 LAD 744 LCx: 95 RCA 176 Total coronary calcium score of 2015, MESA database Percentile: 99th. Dilated ascending thoracic aorta measures up to approximately 4.2 c  PCV MYOCARDIAL PERFUSION WITH LEXISCAN 06/10/2021  Exercise nuclear stress test was performed using Bruce protocol. Patient reached 10.1 METS, and 64% of age predicted maximum heart rate. Because of  sub-maximal heart rate response to exercise, patient was injected with a dose of Intravenous Lexiscan. No chest pain reported. Attenuated heart rate response likely due to medications use. Peak exercise blood pressure was not reported. Stress EKG revealed no ischemic changes. Normal myocardial perfusion. Stress LVEF 64%. Low risk study.   PCV ECHOCARDIOGRAM COMPLETE 06/10/2021  Normal LV systolic function with visual EF 55-60%. Left ventricle cavity is normal in size. Mild left ventricular hypertrophy. Normal global wall motion. Normal diastolic filling pattern, normal LAP. Dilated Aorta (sinotubular junction 31m and proximal ascending aorta 469m. No significant valvular heart  disease. No prior study for comparison.    EKG:   EKG 09/02/2022: Normal sinus rhythm at rate of 68 bpm, normal axis, nonspecific IVCD.  Normal EKG.  No significant change compared to 05/01/2021.  Assessment     ICD-10-CM   1. Coronary artery disease of native artery of native heart with stable angina pectoris ([place])  I25.118 EKG 12-Lead    2. Dyspnea on exertion  R06.09 PCV ECHOCARDIOGRAM COMPLETE    3. Mixed hyperlipidemia  E78.2     4. Primary hypertension  I10     5. Aneurysm of ascending aorta without rupture 4.2, CT 04/27/21  I71.21 PCV ECHOCARDIOGRAM COMPLETE      There are no discontinued medications.   No orders of the defined types were placed in this encounter.   Orders Placed This Encounter  Procedures   EKG 12-Lead   PCV ECHOCARDIOGRAM COMPLETE    Standing Status:   Future    Standing Expiration Date:   09/02/2023   Recommendations:   BRANDIS MCGAHEE is a 62 y.o. Caucasian male patient with family history of premature coronary disease, coronary calcium score in the 99th percentile on 04/28/2021 with a negative nuclear stress test on 06/10/2021, mild aortic root ascending aortic dilatation, hypertension and hyperlipidemia presents here for annual visit. Multiple members in his paternal side  with coronary artery disease and stents in their 1s, his brother died at age 60 with CAD.  1. Coronary artery disease of native artery of native heart with stable angina pectoris [facility]) Patient presents for an annual visit.  He continues to have stable angina pectoris but recently has noticed marked decrease in his exercise capacity and marked dyspnea when he is on the treadmill and also when he was in high altitude, his heart rate went up to 150 bpm, he was markedly dyspneic and felt very uncomfortable.  His symptoms are very concerning for significant CAD, in view of patient being on appropriate medical therapy, we will proceed with left heart catheterization and possible coronary angioplasty.  Schedule for cardiac catheterization, and possible angioplasty. We discussed regarding risks, benefits, alternatives to this including stress testing, CTA and continued medical therapy. Patient wants to proceed. Understands <1-2% risk of death, stroke, MI, urgent CABG, bleeding, infection, renal failure but not limited to these.   2. Mixed hyperlipidemia Patient needs labs, he is going to contact Dr. Jonathon Jordan to have his annual labs done, and also requested to perform LP.  3. Primary hypertension Patient is on appropriate and guideline directed medical therapy, blood pressure is well-controlled.  4. Aneurysm of ascending aorta without rupture 4.2, CT 04/27/21 He has ascending aortic dilatation, he will need repeat echocardiogram which I ordered today.  I will see him back in 6 to 8 weeks, he travels a lot on his job, he is going to [place] next week, will have to schedule his procedure between his schedules.   Adrian Prows, MD, [facility] 09/02/2022, 10:46 AM Office: [phone]

## 2022-09-17 ENCOUNTER — Ambulatory Visit (HOSPITAL_COMMUNITY)
Admission: RE | Admit: 2022-09-17 | Discharge: 2022-09-17 | Disposition: A | Discharge status: Home / Self Care | Payer: 59 | Attending: Cardiology | Admitting: Cardiology

## 2022-09-17 ENCOUNTER — Ambulatory Visit: Payer: 59

## 2022-09-17 ENCOUNTER — Encounter (HOSPITAL_COMMUNITY)
Admission: RE | Disposition: A | Discharge status: Home / Self Care | Payer: Self-pay | Source: Home / Self Care | Attending: Cardiology

## 2022-09-17 ENCOUNTER — Other Ambulatory Visit: Payer: Self-pay

## 2022-09-17 DIAGNOSIS — E782 Mixed hyperlipidemia: Secondary | ICD-10-CM | POA: Diagnosis not present

## 2022-09-17 DIAGNOSIS — Z8249 Family history of ischemic heart disease and other diseases of the circulatory system: Secondary | ICD-10-CM | POA: Diagnosis not present

## 2022-09-17 DIAGNOSIS — I7121 Aneurysm of the ascending aorta, without rupture: Secondary | ICD-10-CM | POA: Diagnosis not present

## 2022-09-17 DIAGNOSIS — R0609 Other forms of dyspnea: Secondary | ICD-10-CM

## 2022-09-17 DIAGNOSIS — R079 Chest pain, unspecified: Secondary | ICD-10-CM | POA: Diagnosis present

## 2022-09-17 DIAGNOSIS — L290 Pruritus ani: Secondary | ICD-10-CM

## 2022-09-17 DIAGNOSIS — R931 Abnormal findings on diagnostic imaging of heart and coronary circulation: Secondary | ICD-10-CM | POA: Diagnosis present

## 2022-09-17 DIAGNOSIS — I10 Essential (primary) hypertension: Secondary | ICD-10-CM | POA: Diagnosis present

## 2022-09-17 DIAGNOSIS — I25118 Atherosclerotic heart disease of native coronary artery with other forms of angina pectoris: Secondary | ICD-10-CM | POA: Diagnosis not present

## 2022-09-17 DIAGNOSIS — E7800 Pure hypercholesterolemia, unspecified: Secondary | ICD-10-CM | POA: Diagnosis present

## 2022-09-17 HISTORY — PX: LEFT HEART CATH AND CORONARY ANGIOGRAPHY: CATH118249

## 2022-09-17 LAB — BASIC METABOLIC PANEL
Anion gap: 11 (ref 5–15)
BUN: 6 mg/dL (ref 6–20)
CO2: 26 mmol/L (ref 22–32)
Calcium: 8.9 mg/dL (ref 8.9–10.3)
Chloride: 102 mmol/L (ref 98–111)
Creatinine, Ser: 0.75 mg/dL (ref 0.61–1.24)
GFR, Estimated: >60 mL/min (ref >60)
Glucose, Bld: 106 mg/dL — ABNORMAL HIGH (ref 70–99)
Potassium: 3.6 mmol/L (ref 3.5–5.1)
Sodium: 139 mmol/L (ref 135–145)

## 2022-09-17 LAB — NO BLOOD PRODUCTS

## 2022-09-17 SURGERY — LEFT HEART CATH AND CORONARY ANGIOGRAPHY
Anesthesia: LOCAL

## 2022-09-17 MED ORDER — MIDAZOLAM HCL 2 MG/2ML IJ SOLN
INTRAMUSCULAR | Status: DC | PRN
  Administered 2022-09-17: 2 mg via INTRAVENOUS

## 2022-09-17 MED ORDER — VERAPAMIL HCL 2.5 MG/ML IV SOLN
INTRAVENOUS | Status: DC | PRN
  Administered 2022-09-17: 5 mL via INTRA_ARTERIAL

## 2022-09-17 MED ORDER — ONDANSETRON HCL 4 MG/2ML IJ SOLN: 4.0000 mg | Freq: Four times a day (QID) | INTRAMUSCULAR | Status: DC | PRN

## 2022-09-17 MED ORDER — HEPARIN (PORCINE) IN NACL 1000-0.9 UT/500ML-% IV SOLN
INTRAVENOUS | Status: DC | PRN
  Administered 2022-09-17 (×2): 500 mL

## 2022-09-17 MED ORDER — SODIUM CHLORIDE 0.9% FLUSH: 3.0000 mL | INTRAVENOUS | Status: DC | PRN

## 2022-09-17 MED ORDER — FENTANYL CITRATE (PF) 100 MCG/2ML IJ SOLN
INTRAMUSCULAR | Status: DC | PRN
  Administered 2022-09-17: 50 ug via INTRAVENOUS

## 2022-09-17 MED ORDER — SODIUM CHLORIDE 0.9% FLUSH: 3.0000 mL | Freq: Two times a day (BID) | INTRAVENOUS | Status: DC

## 2022-09-17 MED ORDER — SODIUM CHLORIDE 0.9 % WEIGHT BASED INFUSION: 1.0000 mL/kg/h | INTRAVENOUS | Status: DC

## 2022-09-17 MED ORDER — SODIUM CHLORIDE 0.9 % IV SOLN: 250.0000 mL | INTRAVENOUS | Status: DC | PRN

## 2022-09-17 MED ORDER — ACETAMINOPHEN 325 MG PO TABS: 650.0000 mg | ORAL_TABLET | ORAL | Status: DC | PRN

## 2022-09-17 MED ORDER — HEPARIN SODIUM (PORCINE) 1000 UNIT/ML IJ SOLN
INTRAMUSCULAR | Status: DC | PRN
  Administered 2022-09-17: 5000 [IU] via INTRAVENOUS

## 2022-09-17 MED ORDER — ASPIRIN 81 MG PO CHEW
81.0000 mg | CHEWABLE_TABLET | ORAL | Status: AC
  Administered 2022-09-17: 81 mg via ORAL
  Filled 2022-09-17: qty 1

## 2022-09-17 MED ORDER — SODIUM CHLORIDE 0.9 % WEIGHT BASED INFUSION
3.0000 mL/kg/h | INTRAVENOUS | Status: AC
  Administered 2022-09-17: 3 mL/kg/h via INTRAVENOUS

## 2022-09-17 MED ORDER — HEPARIN SODIUM (PORCINE) 1000 UNIT/ML IJ SOLN
INTRAMUSCULAR | Status: AC
  Filled 2022-09-17: qty 10

## 2022-09-17 MED ORDER — IOHEXOL 350 MG/ML SOLN
INTRAVENOUS | Status: DC | PRN
  Administered 2022-09-17: 75 mL

## 2022-09-17 MED ORDER — FENTANYL CITRATE (PF) 100 MCG/2ML IJ SOLN
INTRAMUSCULAR | Status: AC
  Filled 2022-09-17: qty 2

## 2022-09-17 MED ORDER — VERAPAMIL HCL 2.5 MG/ML IV SOLN
INTRAVENOUS | Status: AC
  Filled 2022-09-17: qty 2

## 2022-09-17 MED ORDER — LIDOCAINE HCL (PF) 1 % IJ SOLN
INTRAMUSCULAR | Status: DC | PRN
  Administered 2022-09-17: 2 mL

## 2022-09-17 MED ORDER — LIDOCAINE HCL (PF) 1 % IJ SOLN
INTRAMUSCULAR | Status: AC
  Filled 2022-09-17: qty 30

## 2022-09-17 MED ORDER — ASPIRIN 81 MG PO CHEW: 81.0000 mg | CHEWABLE_TABLET | ORAL | Status: DC

## 2022-09-17 MED ORDER — SODIUM CHLORIDE 0.9 % WEIGHT BASED INFUSION: 3.0000 mL/kg/h | INTRAVENOUS | Status: DC

## 2022-09-17 MED ORDER — MIDAZOLAM HCL 2 MG/2ML IJ SOLN
INTRAMUSCULAR | Status: AC
  Filled 2022-09-17: qty 2

## 2022-09-17 SURGICAL SUPPLY — 9 items
CATH OPTITORQUE TIG 4.0 5F (CATHETERS) IMPLANT
DEVICE RAD COMP TR BAND LRG (VASCULAR PRODUCTS) IMPLANT
GLIDESHEATH SLEND A-KIT 6F 22G (SHEATH) IMPLANT
GUIDEWIRE INQWIRE 1.5J.035X260 (WIRE) IMPLANT
INQWIRE 1.5J .035X260CM (WIRE) ×1
KIT HEART LEFT (KITS) ×1 IMPLANT
PACK CARDIAC CATHETERIZATION (CUSTOM PROCEDURE TRAY) ×1 IMPLANT
TRANSDUCER W/STOPCOCK (MISCELLANEOUS) ×1 IMPLANT
TUBING CIL FLEX 10 FLL-RA (TUBING) ×1 IMPLANT

## 2022-09-17 NOTE — Progress Notes (Signed)
Patient states that he is Jehovah's witness and refuses all blood products under any circumstances.

## 2022-09-17 NOTE — Interval H&P Note (Signed)
History and Physical Interval Note:  09/17/2022 10:30 AM  [name]  has presented today for surgery, with the diagnosis of chest pain - doe.  The various methods of treatment have been discussed with the patient and family. After consideration of risks, benefits and other options for treatment, the patient has consented to  Procedure(s): LEFT HEART CATH AND CORONARY ANGIOGRAPHY (N/A) and possible angioplasty as a surgical intervention.  The patient's history has been reviewed, patient examined, no change in status, stable for surgery.  I have reviewed the patient's chart and labs.  Questions were answered to the patient's satisfaction.    Cath Lab Visit (complete for each Cath Lab visit)  Clinical Evaluation Leading to the Procedure:   ACS: No.  Non-ACS:    Anginal Classification: CCS II  Anti-ischemic medical therapy: Minimal Therapy (1 class of medications)  Non-Invasive Test Results: No non-invasive testing performed  Prior CABG: No previous CABG   Adrian Prows

## 2022-09-17 NOTE — Discharge Instructions (Signed)
Radial Site Care  This sheet gives you information about how to care for yourself after your procedure. Your health care provider may also give you more specific instructions. If you have problems or questions, contact your health care provider. What can I expect after the procedure? After the procedure, it is common to have: Bruising and tenderness at the catheter insertion area. Follow these instructions at home: Medicines Take over-the-counter and prescription medicines only as told by your health care provider. Insertion site care Follow instructions from your health care provider about how to take care of your insertion site. Make sure you: Wash your hands with soap and water before you remove your bandage (dressing). If soap and water are not available, use hand sanitizer. May remove dressing in 24 hours. Check your insertion site every day for signs of infection. Check for: Redness, swelling, or pain. Fluid or blood. Pus or a bad smell. Warmth. Do no take baths, swim, or use a hot tub for 5 days. You may shower 24-48 hours after the procedure. Remove the dressing and gently wash the site with plain soap and water. Pat the area dry with a clean towel. Do not rub the site. That could cause bleeding. Do not apply powder or lotion to the site. Activity  For 24 hours after the procedure, or as directed by your health care provider: Do not flex or bend the affected arm. Do not push or pull heavy objects with the affected arm. Do not drive yourself home from the hospital or clinic. You may drive 24 hours after the procedure. Do not operate machinery or power tools. KEEP ARM ELEVATED THE REMAINDER OF THE DAY. Do not push, pull or lift anything that is heavier than 10 lb for 5 days. Ask your health care provider when it is okay to: Return to work or school. Resume usual physical activities or sports. Resume sexual activity. General instructions If the catheter site starts to  bleed, raise your arm and put firm pressure on the site. If the bleeding does not stop, get help right away. This is a medical emergency. DRINK PLENTY OF FLUIDS FOR THE NEXT 2-3 DAYS. No alcohol consumption for 24 hours after receiving sedation. If you went home on the same day as your procedure, a responsible adult should be with you for the first 24 hours after you arrive home. Keep all follow-up visits as told by your health care provider. This is important. Contact a health care provider if: You have a fever. You have redness, swelling, or yellow drainage around your insertion site. Get help right away if: You have unusual pain at the radial site. The catheter insertion area swells very fast. The insertion area is bleeding, and the bleeding does not stop when you hold steady pressure on the area. Your arm or hand becomes pale, cool, tingly, or numb. These symptoms may represent a serious problem that is an emergency. Do not wait to see if the symptoms will go away. Get medical help right away. Call your local emergency services (911 in the [place] Do not drive yourself to the hospital. Summary After the procedure, it is common to have bruising and tenderness at the site. Follow instructions from your health care provider about how to take care of your radial site wound. Check the wound every day for signs of infection.  This information is not intended to replace advice given to you by your health care provider. Make sure you discuss any questions you have with   your health care provider. Document Revised: 07/20/2017 Document Reviewed: 07/20/2017 Elsevier Patient Education  2020 [employer]  

## 2022-09-19 NOTE — Progress Notes (Signed)
Echocardiogram 09/17/2022: Left ventricle cavity is normal in size and wall thickness. Normal global wall motion. Normal LV systolic function with EF 60%. Doppler evidence of grade I (impaired) diastolic dysfunction, normal LAP.  Left atrial cavity is mildly dilated. Aneurysmal interatrial septum without 2D or color Doppler evidence of shunting. No evidence of pulmonary hypertension. The aortic root is mildly dilated at 3.9 cm. No significant change compared to previous study on 06/10/2021.

## 2022-09-20 ENCOUNTER — Encounter (HOSPITAL_COMMUNITY): Payer: Self-pay | Admitting: Cardiology

## 2022-11-25 ENCOUNTER — Other Ambulatory Visit: Payer: Self-pay | Admitting: Sports Medicine

## 2022-11-25 ENCOUNTER — Ambulatory Visit
Admission: RE | Admit: 2022-11-25 | Discharge: 2022-11-25 | Disposition: A | Discharge status: Home / Self Care | Payer: 59 | Source: Ambulatory Visit | Attending: Sports Medicine | Admitting: Sports Medicine

## 2022-11-25 DIAGNOSIS — M25551 Pain in right hip: Secondary | ICD-10-CM

## 2022-11-25 DIAGNOSIS — M25561 Pain in right knee: Secondary | ICD-10-CM

## 2023-02-11 ENCOUNTER — Other Ambulatory Visit: Payer: Self-pay | Admitting: Family Medicine

## 2023-02-11 DIAGNOSIS — K7030 Alcoholic cirrhosis of liver without ascites: Secondary | ICD-10-CM

## 2023-02-14 ENCOUNTER — Ambulatory Visit
Admission: RE | Admit: 2023-02-14 | Discharge: 2023-02-14 | Disposition: A | Discharge status: Home / Self Care | Payer: 59 | Source: Ambulatory Visit | Attending: Family Medicine | Admitting: Family Medicine

## 2023-02-14 DIAGNOSIS — K7030 Alcoholic cirrhosis of liver without ascites: Secondary | ICD-10-CM

## 2024-03-16 ENCOUNTER — Other Ambulatory Visit: Payer: Self-pay | Admitting: Urology

## 2024-03-16 DIAGNOSIS — R972 Elevated prostate specific antigen [PSA]: Secondary | ICD-10-CM

## 2024-04-24 ENCOUNTER — Other Ambulatory Visit: Payer: Self-pay | Admitting: Student

## 2024-04-24 DIAGNOSIS — I712 Thoracic aortic aneurysm, without rupture, unspecified: Secondary | ICD-10-CM

## 2024-05-08 ENCOUNTER — Ambulatory Visit
Admission: RE | Admit: 2024-05-08 | Discharge: 2024-05-08 | Disposition: A | Discharge status: Home / Self Care | Source: Ambulatory Visit | Attending: Urology | Admitting: Urology

## 2024-05-08 DIAGNOSIS — R972 Elevated prostate specific antigen [PSA]: Secondary | ICD-10-CM

## 2024-05-08 MED ORDER — GADOPICLENOL 0.5 MMOL/ML IV SOLN
10.0000 mL | Freq: Once | INTRAVENOUS | Status: AC | PRN
  Administered 2024-05-08: 10 mL via INTRAVENOUS

## 2024-05-09 ENCOUNTER — Ambulatory Visit
Admission: RE | Admit: 2024-05-09 | Discharge: 2024-05-09 | Disposition: A | Discharge status: Home / Self Care | Source: Ambulatory Visit | Attending: Student | Admitting: Student

## 2024-05-09 DIAGNOSIS — I712 Thoracic aortic aneurysm, without rupture, unspecified: Secondary | ICD-10-CM

## 2024-05-09 MED ORDER — GADOPICLENOL 0.5 MMOL/ML IV SOLN
10.0000 mL | Freq: Once | INTRAVENOUS | Status: AC | PRN
  Administered 2024-05-09: 10 mL via INTRAVENOUS

## 2024-06-22 ENCOUNTER — Other Ambulatory Visit: Payer: Self-pay | Admitting: Gastroenterology

## 2024-06-22 DIAGNOSIS — K7469 Other cirrhosis of liver: Secondary | ICD-10-CM

## 2024-07-17 ENCOUNTER — Ambulatory Visit
Admission: RE | Admit: 2024-07-17 | Discharge: 2024-07-17 | Disposition: A | Source: Ambulatory Visit | Attending: Gastroenterology | Admitting: Gastroenterology

## 2024-07-17 DIAGNOSIS — K7469 Other cirrhosis of liver: Secondary | ICD-10-CM

## 2024-08-03 ENCOUNTER — Ambulatory Visit: Admission: RE | Admit: 2024-08-03 | Source: Ambulatory Visit

## 2024-08-03 ENCOUNTER — Other Ambulatory Visit: Payer: Self-pay

## 2024-08-03 DIAGNOSIS — M25512 Pain in left shoulder: Secondary | ICD-10-CM
